# Patient Record
Sex: Female | Born: 1980 | Race: Black or African American | Hispanic: No | Marital: Single | Smoking: Never smoker
Health system: Southern US, Community
[De-identification: ages and names within clinical notes are randomized; demographics above are authoritative.]

## PROBLEM LIST (undated history)

## (undated) DIAGNOSIS — F419 Anxiety disorder, unspecified: Secondary | ICD-10-CM

## (undated) DIAGNOSIS — T7840XA Allergy, unspecified, initial encounter: Secondary | ICD-10-CM

## (undated) DIAGNOSIS — I2699 Other pulmonary embolism without acute cor pulmonale: Secondary | ICD-10-CM

## (undated) DIAGNOSIS — J302 Other seasonal allergic rhinitis: Secondary | ICD-10-CM

## (undated) DIAGNOSIS — R51 Headache: Secondary | ICD-10-CM

## (undated) DIAGNOSIS — J40 Bronchitis, not specified as acute or chronic: Secondary | ICD-10-CM

## (undated) DIAGNOSIS — B009 Herpesviral infection, unspecified: Secondary | ICD-10-CM

## (undated) DIAGNOSIS — F32A Depression, unspecified: Secondary | ICD-10-CM

## (undated) DIAGNOSIS — D649 Anemia, unspecified: Secondary | ICD-10-CM

## (undated) DIAGNOSIS — R519 Headache, unspecified: Secondary | ICD-10-CM

## (undated) DIAGNOSIS — J45909 Unspecified asthma, uncomplicated: Secondary | ICD-10-CM

## (undated) DIAGNOSIS — G709 Myoneural disorder, unspecified: Secondary | ICD-10-CM

## (undated) HISTORY — PX: TUBAL LIGATION: SHX77

## (undated) HISTORY — PX: WISDOM TOOTH EXTRACTION: SHX21

## (undated) HISTORY — PX: THERAPEUTIC ABORTION: SHX798

## (undated) HISTORY — DX: Other pulmonary embolism without acute cor pulmonale: I26.99

## (undated) HISTORY — DX: Anxiety disorder, unspecified: F41.9

## (undated) HISTORY — DX: Depression, unspecified: F32.A

## (undated) HISTORY — PX: FRACTURE SURGERY: SHX138

## (undated) HISTORY — PX: DILATION AND CURETTAGE OF UTERUS: SHX78

## (undated) HISTORY — DX: Allergy, unspecified, initial encounter: T78.40XA

## (undated) HISTORY — PX: FINGER SURGERY: SHX640

---

## 2013-10-04 ENCOUNTER — Encounter (HOSPITAL_COMMUNITY): Payer: Self-pay | Admitting: Emergency Medicine

## 2013-10-04 ENCOUNTER — Other Ambulatory Visit (HOSPITAL_COMMUNITY)
Admission: RE | Admit: 2013-10-04 | Discharge: 2013-10-04 | Disposition: A | Discharge status: Home / Self Care | Payer: 59 | Source: Ambulatory Visit | Attending: Emergency Medicine | Admitting: Emergency Medicine

## 2013-10-04 ENCOUNTER — Emergency Department (INDEPENDENT_AMBULATORY_CARE_PROVIDER_SITE_OTHER)
Admission: EM | Admit: 2013-10-04 | Discharge: 2013-10-04 | Disposition: A | Discharge status: Home / Self Care | Payer: 59 | Source: Home / Self Care | Attending: Emergency Medicine | Admitting: Emergency Medicine

## 2013-10-04 DIAGNOSIS — Z113 Encounter for screening for infections with a predominantly sexual mode of transmission: Secondary | ICD-10-CM | POA: Insufficient documentation

## 2013-10-04 DIAGNOSIS — B009 Herpesviral infection, unspecified: Secondary | ICD-10-CM

## 2013-10-04 DIAGNOSIS — N760 Acute vaginitis: Secondary | ICD-10-CM | POA: Insufficient documentation

## 2013-10-04 DIAGNOSIS — N898 Other specified noninflammatory disorders of vagina: Secondary | ICD-10-CM

## 2013-10-04 MED ORDER — VALACYCLOVIR HCL 1 G PO TABS: 1000.0000 mg | ORAL_TABLET | Freq: Every day | ORAL | Status: AC

## 2013-10-04 NOTE — ED Provider Notes (Signed)
CSN: [id number]     Arrival date & time 10/04/13  1552 History   First MD Initiated Contact with Patient 10/04/13 1643     No chief complaint on file.    (Consider location/radiation/quality/duration/timing/severity/associated sxs/prior Treatment) Patient is a 33 y.o. female presenting with STD exposure. The history is provided by the patient.  Exposure to STD This is a recurrent problem. The current episode started more than 1 week ago. The problem occurs constantly. The problem has been gradually worsening.   Ileigh Mettler is a 33 y.o. female who presents to the UC with vaginal lesions and burning that started about a week ago. 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 She is here for medication. She denies UTI symptoms.  She also complains of vaginal discharge and request testing for other STI's. She denies fever, chills, nausea, vomiting or any other problems. Last sexual intercourse 9 months ago. Hx of trichomonas. Condoms for birth control.    No past medical history on file. No past surgical history on file. No family history on file. History  Substance Use Topics  . Smoking status: Not on file  . Smokeless tobacco: Not on file  . Alcohol Use: Not on file   OB History   No data available     Review of Systems Negative except as stated in HPI   Allergies  Review of patient's allergies indicates not on file.  Home Medications  No current outpatient prescriptions on file. There were no vitals taken for this visit. Physical Exam  Nursing note and vitals reviewed. Constitutional: She is oriented to person, place, and time. She appears well-developed and well-nourished.  HENT:  Head: Atraumatic.  Eyes: EOM are normal.  Neck: Normal range of motion. Neck supple.  Cardiovascular: Normal rate.   Pulmonary/Chest: Effort normal.  Abdominal: Soft. There is no tenderness.  Genitourinary:  External genitalia with tender lesion noted left  labia major. White discharge vaginal vault.   Musculoskeletal: Normal range of motion.  Neurological: She is alert and oriented to person, place, and time. No cranial nerve deficit.  Skin: Skin is warm and dry.  Psychiatric: She has a normal mood and affect. Her behavior is normal.    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 Rx given for Valtrex. Referral to GYN. Discussed with the patient and all questioned fully answered. She will return if any problems arise.    Medication List         valACYclovir 1000 MG tablet  Commonly known as:  VALTREX  Take 1 tablet (1,000 mg total) by mouth daily.           [place], [place] 10/04/13 2204

## 2013-10-04 NOTE — ED Notes (Signed)
Pt  Reports    Symptoms  Of    A  Sore burning area   On  Vaginal  Area       She  States  Has  Had  Herpes  In  Past          She  denys  Any      Vaginal  Discharge          Or  Any bleeding

## 2013-10-04 NOTE — Discharge Instructions (Signed)
We have tested you today for gonorrhea, chlamydia, trichomonas, yeast and bacterial vaginosis. Those will not be back for a couple days. Will will call if you need additional treatment. Call Dr. Anson Crofts office for your Pap smear and follow up. Return here as needed.

## 2013-10-05 LAB — CERVICOVAGINAL ANCILLARY ONLY
Chlamydia: NEGATIVE
Neisseria Gonorrhea: NEGATIVE

## 2013-10-05 NOTE — Progress Notes (Signed)
Quick Note:  Test result was normal. No further action is needed at this time. ______ 

## 2013-10-05 NOTE — ED Provider Notes (Signed)
Medical screening examination/treatment/procedure(s) were performed by non-physician practitioner and as supervising physician I was immediately available for consultation/collaboration.  Mackensi Mahadeo, M.D.  Tyniah Kastens C Shavelle Runkel, MD 10/05/13 0805 

## 2013-10-06 ENCOUNTER — Telehealth (HOSPITAL_COMMUNITY): Payer: Self-pay | Admitting: *Deleted

## 2013-10-06 ENCOUNTER — Telehealth (HOSPITAL_COMMUNITY): Payer: Self-pay | Admitting: Emergency Medicine

## 2013-10-06 MED ORDER — METRONIDAZOLE 500 MG PO TABS
500.0000 mg | ORAL_TABLET | Freq: Two times a day (BID) | ORAL | Status: DC
Start: 2013-10-06 — End: 2018-04-01

## 2013-10-06 NOTE — ED Notes (Signed)
The patient's DNA probe came back positive for Gardnerella. She was not treated. Prescription will be called in for her for metronidazole 500 mg, #14, one twice a day. She does not have pharmacy listed, so we will need to call her, to confirm her pharmacy, then call a prescription into her pharmacy.  Harden Mo, MD 10/06/13 [phone]

## 2013-10-06 NOTE — ED Notes (Addendum)
GC/Chlamydia neg., Affirm: Gardnerella pos., Candida and Trich neg.  2/17 Message to Dr. Jake Michaelis.  2/18 Order obtained.  I called pt. and left a message to call Call 1. Roselyn Meier 10/06/2013 Pt. called back.  Pt. verified x 2 and given results.  Pt. told she needs Flagyl for bacterial vaginosis.   Pt. instructed to no alcohol while taking this medication.  Pt. wants Rx. called to [facility] on [address].  Rx. called to pharmacist @ [phone]. 10/06/2013

## 2013-10-06 NOTE — Telephone Encounter (Signed)
Message copied by Harden Mo on Thu Oct 06, 2013  7:51 AM ------      Message from: Roselyn Meier      Created: Wed Oct 05, 2013  7:53 PM      Regarding: lab       The way they document now, it shows the reference range as neg. But when you open to view it, I can see that pt. was pos. for Gardnerella and no treatment noted.  You charted on result note, no further action.  Do you want to treat this?      Roselyn Meier      10/05/2013       ------

## 2014-01-18 ENCOUNTER — Ambulatory Visit: Payer: Self-pay | Admitting: Obstetrics & Gynecology

## 2014-03-02 ENCOUNTER — Ambulatory Visit: Payer: Self-pay | Admitting: Obstetrics & Gynecology

## 2014-03-16 ENCOUNTER — Ambulatory Visit: Payer: Self-pay | Admitting: Obstetrics & Gynecology

## 2014-08-14 ENCOUNTER — Encounter: Payer: Self-pay | Admitting: *Deleted

## 2014-08-15 ENCOUNTER — Encounter: Payer: Self-pay | Admitting: Obstetrics & Gynecology

## 2017-08-18 HISTORY — PX: CARPAL TUNNEL RELEASE: SHX101

## 2017-09-15 DIAGNOSIS — G5603 Carpal tunnel syndrome, bilateral upper limbs: Secondary | ICD-10-CM | POA: Insufficient documentation

## 2017-10-28 ENCOUNTER — Encounter: Payer: Self-pay | Admitting: Obstetrics & Gynecology

## 2017-11-25 ENCOUNTER — Ambulatory Visit (INDEPENDENT_AMBULATORY_CARE_PROVIDER_SITE_OTHER): Payer: Medicaid Other | Admitting: Obstetrics & Gynecology

## 2017-11-25 ENCOUNTER — Encounter: Payer: Self-pay | Admitting: Obstetrics & Gynecology

## 2017-11-25 VITALS — BP 122/82 | HR 100 | Wt 223.4 lb

## 2017-11-25 DIAGNOSIS — D219 Benign neoplasm of connective and other soft tissue, unspecified: Secondary | ICD-10-CM | POA: Diagnosis not present

## 2017-11-25 DIAGNOSIS — N920 Excessive and frequent menstruation with regular cycle: Secondary | ICD-10-CM | POA: Diagnosis not present

## 2017-11-25 DIAGNOSIS — I2699 Other pulmonary embolism without acute cor pulmonale: Secondary | ICD-10-CM | POA: Diagnosis not present

## 2017-11-25 NOTE — Progress Notes (Signed)
Patient ID: Bonnie Myers, female   DOB: April 05, 1981, 37 y.o.   MRN: [id number]  No chief complaint on file.   HPI Bonnie Myers is a 37 y.o. female.  Single P2 (31 and 60 yo kids) here today with the issue of longstanding heavy painful periods. She had an u/s in the past that showed per her adeno and polyps. Her periods are monthly, lasting about 5 days. She has a h/o PE and cannot take OCPs. She had a BTL about 13 years ago. She takes IBU 800 with very little help. Nothing makes the pain worse. Denies dyspareunia. HPI  No past medical history on file.  No past surgical history on file.  No family history on file.  Social History Social History   Tobacco Use  . Smoking status: Never Smoker  . Smokeless tobacco: Never Used  Substance Use Topics  . Alcohol use: Yes  . Drug use: Not on file    Allergies  Allergen Reactions  . Clindamycin/Lincomycin Hives  . Naproxen Hives    Current Outpatient Medications  Medication Sig Dispense Refill  . ibuprofen (ADVIL,MOTRIN) 800 MG tablet Take 800 mg by mouth every 8 (eight) hours as needed for cramping.    . montelukast (SINGULAIR) 10 MG tablet Take 20 mg by mouth at bedtime.    . metroNIDAZOLE (FLAGYL) 500 MG tablet Take 1 tablet (500 mg total) by mouth 2 (two) times daily. 14 tablet 0   No current facility-administered medications for this visit.     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  Physical Exam Physical Exam  Breathing, conversing, and ambulating normally Well nourished, well hydrated Black female, no apparent distress Abd- benign, obese Cervix-no lesions, heavy bleeding  Data Reviewed   Assessment    Menorrhagia dysmenorrhea    Plan   check CBC, TSH, gyn  [place]  Come back 2 weeks       Jalasia Eskridge C Kingdom Vanzanten 11/25/2017, 3:25 PM

## 2017-11-26 LAB — CBC
HEMATOCRIT: 30.9 % — AB (ref 34.0–46.6)
HEMOGLOBIN: 8.7 g/dL — AB (ref 11.1–15.9)
MCH: 19 pg — ABNORMAL LOW (ref 26.6–33.0)
MCHC: 28.2 g/dL — ABNORMAL LOW (ref 31.5–35.7)
MCV: 67 fL — AB (ref 79–97)
Platelets: 411 10*3/uL — ABNORMAL HIGH (ref 150–379)
RBC: 4.59 x10E6/uL (ref 3.77–5.28)
RDW: 19.9 % — ABNORMAL HIGH (ref 12.3–15.4)
WBC: 8.4 10*3/uL (ref 3.4–10.8)

## 2017-11-26 LAB — TSH: TSH: 0.977 u[IU]/mL (ref 0.450–4.500)

## 2017-11-30 ENCOUNTER — Ambulatory Visit (HOSPITAL_COMMUNITY)
Admission: RE | Admit: 2017-11-30 | Discharge: 2017-11-30 | Disposition: A | Discharge status: Home / Self Care | Payer: Medicaid Other | Source: Ambulatory Visit | Attending: Obstetrics & Gynecology | Admitting: Obstetrics & Gynecology

## 2017-11-30 DIAGNOSIS — D219 Benign neoplasm of connective and other soft tissue, unspecified: Secondary | ICD-10-CM | POA: Diagnosis not present

## 2017-12-11 ENCOUNTER — Ambulatory Visit: Payer: Medicaid Other | Admitting: Obstetrics & Gynecology

## 2017-12-11 ENCOUNTER — Encounter

## 2018-01-01 ENCOUNTER — Encounter (HOSPITAL_BASED_OUTPATIENT_CLINIC_OR_DEPARTMENT_OTHER): Payer: Self-pay

## 2018-01-01 ENCOUNTER — Ambulatory Visit (HOSPITAL_BASED_OUTPATIENT_CLINIC_OR_DEPARTMENT_OTHER): Admit: 2018-01-01 | Payer: Medicaid Other | Admitting: Orthopedic Surgery

## 2018-01-01 SURGERY — CARPAL TUNNEL RELEASE
Anesthesia: General | Laterality: Left

## 2018-03-05 DIAGNOSIS — G5602 Carpal tunnel syndrome, left upper limb: Secondary | ICD-10-CM | POA: Diagnosis not present

## 2018-03-15 DIAGNOSIS — D509 Iron deficiency anemia, unspecified: Secondary | ICD-10-CM | POA: Diagnosis not present

## 2018-03-15 DIAGNOSIS — Z0000 Encounter for general adult medical examination without abnormal findings: Secondary | ICD-10-CM | POA: Diagnosis not present

## 2018-03-30 DIAGNOSIS — G5603 Carpal tunnel syndrome, bilateral upper limbs: Secondary | ICD-10-CM | POA: Diagnosis not present

## 2018-04-01 ENCOUNTER — Encounter: Payer: Self-pay | Admitting: Obstetrics & Gynecology

## 2018-04-01 ENCOUNTER — Ambulatory Visit (INDEPENDENT_AMBULATORY_CARE_PROVIDER_SITE_OTHER): Payer: 59 | Admitting: Obstetrics & Gynecology

## 2018-04-01 VITALS — BP 118/55 | HR 85 | Wt 224.0 lb

## 2018-04-01 DIAGNOSIS — N946 Dysmenorrhea, unspecified: Secondary | ICD-10-CM | POA: Diagnosis not present

## 2018-04-01 DIAGNOSIS — N920 Excessive and frequent menstruation with regular cycle: Secondary | ICD-10-CM | POA: Insufficient documentation

## 2018-04-01 DIAGNOSIS — D649 Anemia, unspecified: Secondary | ICD-10-CM | POA: Diagnosis not present

## 2018-04-01 LAB — POCT PREGNANCY, URINE: Preg Test, Ur: NEGATIVE

## 2018-04-01 MED ORDER — FERROUS SULFATE 325 (65 FE) MG PO TABS: 325.0000 mg | ORAL_TABLET | Freq: Every day | ORAL | 3 refills | Status: DC

## 2018-04-01 NOTE — Progress Notes (Signed)
   Subjective:    Patient ID: Bonnie Myers, female    DOB: July 30, 1981, 37 y.o.   MRN: [id number]  HPI  37 yo single P2 here with the issue of heavy painful periods. 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   She has had a BTL. Also had a PE in about 2003 and took coumadin and then heparin for a total of 3 years. She associates depo provera with the formation of the PE.  Review of Systems She has chronic constipation. Works at [employer], Print production planner service    Objective:   Physical Exam Breathing, conversing, and ambulating normally Well nourished, well hydrated Black female, no apparent distress Abd- benign     Assessment & Plan:  Heavy painful periods with anemia- rec look for causes of pelvic pain at time of laparoscopy Right dermoid- plan for laparoscopic right ovarian cystectomy/possible right oopherectomy Anemia- hbg 8.7 on 11/25/17 Repeat cbc today and schedule iron infusion and prescribing oral iron Rec miralax DAILY Suggested/rec'd endometrial ablation (HTA) but discussed risk of post ablation syndrome

## 2018-04-02 ENCOUNTER — Encounter (HOSPITAL_COMMUNITY): Payer: Self-pay

## 2018-04-15 DIAGNOSIS — R42 Dizziness and giddiness: Secondary | ICD-10-CM | POA: Diagnosis not present

## 2018-04-15 DIAGNOSIS — J45909 Unspecified asthma, uncomplicated: Secondary | ICD-10-CM | POA: Diagnosis not present

## 2018-04-15 DIAGNOSIS — H9191 Unspecified hearing loss, right ear: Secondary | ICD-10-CM | POA: Diagnosis not present

## 2018-04-21 ENCOUNTER — Other Ambulatory Visit: Payer: Self-pay | Admitting: Obstetrics & Gynecology

## 2018-04-21 ENCOUNTER — Telehealth: Payer: Self-pay

## 2018-04-21 NOTE — Telephone Encounter (Signed)
LM for [facility] to return call to the office to schedule an iron infusion.

## 2018-04-21 NOTE — Progress Notes (Signed)
ferreheme ordered for 3 weekly doses

## 2018-04-23 NOTE — Telephone Encounter (Signed)
Scheduled feraheme infusion for 9/13 @ 9am. Patient will need to check in at 2nd floor admitting. Called patient, no answer- unable to leave message. Will send mychart message

## 2018-04-26 NOTE — Patient Instructions (Addendum)
Your procedure is scheduled on: Thursday, 9/19  Enter through the Main Entrance of [facility] at: 6 am  Pick up the phone at the desk and dial [phone].  Call this number if you have problems the morning of surgery: [phone].  Remember: Do NOT eat food or Do NOT drink clear liquids (including water) after midnight Wednesday.  Take these medicines the morning of surgery with a SIP OF WATER: Singulair  Brush your teeth on the day of surgery.  Bring albuterol inhaler with you on day of surgery.  Stop herbal medications, vitamin supplements, Ibuprofen/NSAIDS at this time.  Do NOT wear jewelry (body piercing), metal hair clips/bobby pins, make-up, or nail polish. Do NOT wear lotions, powders, or perfumes.  You may wear deoderant. Do NOT shave for 48 hours prior to surgery. Do NOT bring valuables to the hospital.  Leave suitcase in car.  After surgery it may be brought to your room.  For patients admitted to the hospital, checkout time is 11:00 AM the day of discharge. Have a responsible adult drive you home and stay with you for 24 hours after your procedure.  Home with Mother Bonnie Myers cell [phone].

## 2018-04-30 ENCOUNTER — Ambulatory Visit (HOSPITAL_COMMUNITY)
Admission: RE | Admit: 2018-04-30 | Discharge: 2018-04-30 | Disposition: A | Discharge status: Home / Self Care | Payer: 59 | Source: Ambulatory Visit | Attending: Obstetrics & Gynecology | Admitting: Obstetrics & Gynecology

## 2018-04-30 ENCOUNTER — Encounter (HOSPITAL_COMMUNITY): Payer: Self-pay

## 2018-04-30 ENCOUNTER — Encounter (HOSPITAL_COMMUNITY)
Admission: RE | Admit: 2018-04-30 | Discharge: 2018-04-30 | Disposition: A | Discharge status: Home / Self Care | Payer: 59 | Source: Ambulatory Visit | Attending: Obstetrics & Gynecology | Admitting: Obstetrics & Gynecology

## 2018-04-30 ENCOUNTER — Other Ambulatory Visit: Payer: Self-pay

## 2018-04-30 DIAGNOSIS — D500 Iron deficiency anemia secondary to blood loss (chronic): Secondary | ICD-10-CM | POA: Diagnosis present

## 2018-04-30 HISTORY — DX: Headache, unspecified: R51.9

## 2018-04-30 HISTORY — DX: Headache: R51

## 2018-04-30 HISTORY — DX: Bronchitis, not specified as acute or chronic: J40

## 2018-04-30 HISTORY — DX: Myoneural disorder, unspecified: G70.9

## 2018-04-30 HISTORY — DX: Herpesviral infection, unspecified: B00.9

## 2018-04-30 HISTORY — DX: Other seasonal allergic rhinitis: J30.2

## 2018-04-30 HISTORY — DX: Anemia, unspecified: D64.9

## 2018-04-30 HISTORY — DX: Unspecified asthma, uncomplicated: J45.909

## 2018-04-30 LAB — CBC
HCT: 27.1 % — ABNORMAL LOW (ref 36.0–46.0)
Hemoglobin: 7.7 g/dL — ABNORMAL LOW (ref 12.0–15.0)
MCH: 19 pg — ABNORMAL LOW (ref 26.0–34.0)
MCHC: 28.4 g/dL — ABNORMAL LOW (ref 30.0–36.0)
MCV: 66.9 fL — ABNORMAL LOW (ref 78.0–100.0)
Platelets: 317 10*3/uL (ref 150–400)
RBC: 4.05 MIL/uL (ref 3.87–5.11)
RDW: 19.3 % — AB (ref 11.5–15.5)
WBC: 10.6 10*3/uL — AB (ref 4.0–10.5)

## 2018-04-30 MED ORDER — SODIUM CHLORIDE 0.9 % IV SOLN
510.0000 mg | INTRAVENOUS | Status: DC
  Administered 2018-04-30: 510 mg via INTRAVENOUS
  Filled 2018-04-30: qty 17

## 2018-04-30 NOTE — Discharge Instructions (Signed)

## 2018-05-03 DIAGNOSIS — R6884 Jaw pain: Secondary | ICD-10-CM | POA: Diagnosis not present

## 2018-05-03 DIAGNOSIS — R040 Epistaxis: Secondary | ICD-10-CM | POA: Diagnosis not present

## 2018-05-03 DIAGNOSIS — H93291 Other abnormal auditory perceptions, right ear: Secondary | ICD-10-CM | POA: Insufficient documentation

## 2018-05-03 DIAGNOSIS — Z0110 Encounter for examination of ears and hearing without abnormal findings: Secondary | ICD-10-CM | POA: Diagnosis not present

## 2018-05-03 DIAGNOSIS — R42 Dizziness and giddiness: Secondary | ICD-10-CM | POA: Insufficient documentation

## 2018-05-05 NOTE — Anesthesia Preprocedure Evaluation (Addendum)
Anesthesia Evaluation  Patient identified by MRN, date of birth, ID band Patient awake    Reviewed: Allergy & Precautions, H&P , NPO status , Patient's Chart, lab work & pertinent test results, reviewed documented beta blocker date and time   Airway Mallampati: II  TM Distance: >3 FB Neck ROM: full    Dental no notable dental hx. (+) Teeth Intact   Pulmonary asthma ,    Pulmonary exam normal breath sounds clear to auscultation       Cardiovascular Exercise Tolerance: Good negative cardio ROS   Rhythm:regular Rate:Normal     Neuro/Psych negative neurological ROS  negative psych ROS   GI/Hepatic negative GI ROS, Neg liver ROS,   Endo/Other  negative endocrine ROS  Renal/GU negative Renal ROS  negative genitourinary   Musculoskeletal   Abdominal   Peds  Hematology  (+) anemia ,   Anesthesia Other Findings   Reproductive/Obstetrics negative OB ROS                            Anesthesia Physical Anesthesia Plan  ASA: II  Anesthesia Plan: General   Post-op Pain Management:    Induction: Intravenous  PONV Risk Score and Plan: 3 and Ondansetron, Treatment may vary due to age or medical condition, Scopolamine patch - Pre-op and Dexamethasone  Airway Management Planned: Oral ETT  Additional Equipment:   Intra-op Plan:   Post-operative Plan: Extubation in OR  Informed Consent: I have reviewed the patients History and Physical, chart, labs and discussed the procedure including the risks, benefits and alternatives for the proposed anesthesia with the patient or authorized representative who has indicated his/her understanding and acceptance.   Dental Advisory Given  Plan Discussed with: CRNA, Anesthesiologist and Surgeon  Anesthesia Plan Comments: (  )        Anesthesia Quick Evaluation

## 2018-05-06 ENCOUNTER — Encounter (HOSPITAL_COMMUNITY): Payer: Self-pay | Admitting: *Deleted

## 2018-05-06 ENCOUNTER — Ambulatory Visit (HOSPITAL_COMMUNITY): Payer: 59 | Admitting: Anesthesiology

## 2018-05-06 ENCOUNTER — Ambulatory Visit (HOSPITAL_COMMUNITY)
Admission: AD | Admit: 2018-05-06 | Discharge: 2018-05-06 | Disposition: A | Discharge status: Home / Self Care | Payer: 59 | Source: Ambulatory Visit | Attending: Obstetrics & Gynecology | Admitting: Obstetrics & Gynecology

## 2018-05-06 ENCOUNTER — Encounter (HOSPITAL_COMMUNITY)
Admission: AD | Disposition: A | Discharge status: Home / Self Care | Payer: Self-pay | Source: Ambulatory Visit | Attending: Obstetrics & Gynecology

## 2018-05-06 ENCOUNTER — Other Ambulatory Visit: Payer: Self-pay

## 2018-05-06 DIAGNOSIS — N938 Other specified abnormal uterine and vaginal bleeding: Secondary | ICD-10-CM | POA: Insufficient documentation

## 2018-05-06 DIAGNOSIS — R102 Pelvic and perineal pain: Secondary | ICD-10-CM | POA: Diagnosis not present

## 2018-05-06 DIAGNOSIS — Z79899 Other long term (current) drug therapy: Secondary | ICD-10-CM | POA: Diagnosis not present

## 2018-05-06 DIAGNOSIS — J45909 Unspecified asthma, uncomplicated: Secondary | ICD-10-CM | POA: Diagnosis not present

## 2018-05-06 DIAGNOSIS — G8929 Other chronic pain: Secondary | ICD-10-CM | POA: Diagnosis not present

## 2018-05-06 DIAGNOSIS — Z881 Allergy status to other antibiotic agents status: Secondary | ICD-10-CM | POA: Diagnosis not present

## 2018-05-06 DIAGNOSIS — N920 Excessive and frequent menstruation with regular cycle: Secondary | ICD-10-CM | POA: Insufficient documentation

## 2018-05-06 DIAGNOSIS — D649 Anemia, unspecified: Secondary | ICD-10-CM | POA: Insufficient documentation

## 2018-05-06 DIAGNOSIS — Z888 Allergy status to other drugs, medicaments and biological substances status: Secondary | ICD-10-CM | POA: Diagnosis not present

## 2018-05-06 DIAGNOSIS — Z86711 Personal history of pulmonary embolism: Secondary | ICD-10-CM | POA: Insufficient documentation

## 2018-05-06 DIAGNOSIS — D270 Benign neoplasm of right ovary: Secondary | ICD-10-CM | POA: Diagnosis not present

## 2018-05-06 DIAGNOSIS — Z880 Allergy status to penicillin: Secondary | ICD-10-CM | POA: Diagnosis not present

## 2018-05-06 DIAGNOSIS — Z883 Allergy status to other anti-infective agents status: Secondary | ICD-10-CM | POA: Insufficient documentation

## 2018-05-06 DIAGNOSIS — N946 Dysmenorrhea, unspecified: Secondary | ICD-10-CM | POA: Insufficient documentation

## 2018-05-06 DIAGNOSIS — N803 Endometriosis of pelvic peritoneum: Secondary | ICD-10-CM | POA: Insufficient documentation

## 2018-05-06 DIAGNOSIS — N840 Polyp of corpus uteri: Secondary | ICD-10-CM | POA: Insufficient documentation

## 2018-05-06 DIAGNOSIS — N809 Endometriosis, unspecified: Secondary | ICD-10-CM | POA: Diagnosis not present

## 2018-05-06 HISTORY — PX: DILITATION & CURRETTAGE/HYSTROSCOPY WITH HYDROTHERMAL ABLATION: SHX5570

## 2018-05-06 HISTORY — PX: LAPAROSCOPIC OVARIAN CYSTECTOMY: SHX6248

## 2018-05-06 LAB — CBC
HEMATOCRIT: 32.9 % — AB (ref 36.0–46.0)
HEMOGLOBIN: 9.2 g/dL — AB (ref 12.0–15.0)
MCH: 19.7 pg — ABNORMAL LOW (ref 26.0–34.0)
MCHC: 28 g/dL — AB (ref 30.0–36.0)
MCV: 70.6 fL — ABNORMAL LOW (ref 78.0–100.0)
Platelets: 399 10*3/uL (ref 150–400)
RBC: 4.66 MIL/uL (ref 3.87–5.11)
RDW: 23.1 % — AB (ref 11.5–15.5)
WBC: 10.4 10*3/uL (ref 4.0–10.5)

## 2018-05-06 LAB — PREGNANCY, URINE: Preg Test, Ur: NEGATIVE

## 2018-05-06 SURGERY — EXCISION, CYST, OVARY, LAPAROSCOPIC
Anesthesia: General | Laterality: Right

## 2018-05-06 MED ORDER — OXYCODONE HCL 5 MG PO TABS
ORAL_TABLET | ORAL | Status: AC
  Filled 2018-05-06: qty 1

## 2018-05-06 MED ORDER — MIDAZOLAM HCL 2 MG/2ML IJ SOLN
INTRAMUSCULAR | Status: DC | PRN
  Administered 2018-05-06: 2 mg via INTRAVENOUS

## 2018-05-06 MED ORDER — OXYCODONE HCL 5 MG/5ML PO SOLN: 5.0000 mg | Freq: Once | ORAL | Status: AC | PRN

## 2018-05-06 MED ORDER — OXYCODONE-ACETAMINOPHEN 5-325 MG PO TABS: 1.0000 | ORAL_TABLET | Freq: Four times a day (QID) | ORAL | 0 refills | Status: DC | PRN

## 2018-05-06 MED ORDER — DEXAMETHASONE SODIUM PHOSPHATE 4 MG/ML IJ SOLN
INTRAMUSCULAR | Status: AC
  Filled 2018-05-06: qty 1

## 2018-05-06 MED ORDER — OXYCODONE HCL 5 MG PO TABS
5.0000 mg | ORAL_TABLET | Freq: Once | ORAL | Status: AC | PRN
  Administered 2018-05-06: 5 mg via ORAL

## 2018-05-06 MED ORDER — ROCURONIUM BROMIDE 100 MG/10ML IV SOLN
INTRAVENOUS | Status: AC
  Filled 2018-05-06: qty 1

## 2018-05-06 MED ORDER — FENTANYL CITRATE (PF) 250 MCG/5ML IJ SOLN
INTRAMUSCULAR | Status: AC
  Filled 2018-05-06: qty 5

## 2018-05-06 MED ORDER — LIDOCAINE HCL (CARDIAC) PF 100 MG/5ML IV SOSY
PREFILLED_SYRINGE | INTRAVENOUS | Status: AC
  Filled 2018-05-06: qty 5

## 2018-05-06 MED ORDER — LACTATED RINGERS IV SOLN
INTRAVENOUS | Status: DC
  Administered 2018-05-06 (×2): via INTRAVENOUS
  Administered 2018-05-06: 1000 mL via INTRAVENOUS

## 2018-05-06 MED ORDER — ACETAMINOPHEN 160 MG/5ML PO SOLN: 325.0000 mg | ORAL | Status: DC | PRN

## 2018-05-06 MED ORDER — PROPOFOL 10 MG/ML IV BOLUS
INTRAVENOUS | Status: AC
  Filled 2018-05-06: qty 20

## 2018-05-06 MED ORDER — HYDROMORPHONE HCL 1 MG/ML IJ SOLN
INTRAMUSCULAR | Status: AC
  Filled 2018-05-06: qty 1

## 2018-05-06 MED ORDER — SCOPOLAMINE 1 MG/3DAYS TD PT72
MEDICATED_PATCH | TRANSDERMAL | Status: AC
  Administered 2018-05-06: 1.5 mg via TRANSDERMAL
  Filled 2018-05-06: qty 1

## 2018-05-06 MED ORDER — FENTANYL CITRATE (PF) 100 MCG/2ML IJ SOLN
INTRAMUSCULAR | Status: AC
  Filled 2018-05-06: qty 2

## 2018-05-06 MED ORDER — SUGAMMADEX SODIUM 200 MG/2ML IV SOLN
INTRAVENOUS | Status: AC
  Filled 2018-05-06: qty 2

## 2018-05-06 MED ORDER — MIDAZOLAM HCL 2 MG/2ML IJ SOLN
INTRAMUSCULAR | Status: AC
  Filled 2018-05-06: qty 2

## 2018-05-06 MED ORDER — BUPIVACAINE HCL (PF) 0.5 % IJ SOLN
INTRAMUSCULAR | Status: AC
  Filled 2018-05-06: qty 30

## 2018-05-06 MED ORDER — HYDROMORPHONE HCL 1 MG/ML IJ SOLN
0.5000 mg | INTRAMUSCULAR | Status: DC | PRN
  Administered 2018-05-06 (×2): 0.5 mg via INTRAVENOUS

## 2018-05-06 MED ORDER — ACETAMINOPHEN 325 MG PO TABS: 325.0000 mg | ORAL_TABLET | ORAL | Status: DC | PRN

## 2018-05-06 MED ORDER — LIDOCAINE HCL (CARDIAC) PF 100 MG/5ML IV SOSY
PREFILLED_SYRINGE | INTRAVENOUS | Status: DC | PRN
  Administered 2018-05-06: 50 mg via INTRAVENOUS

## 2018-05-06 MED ORDER — PROMETHAZINE HCL 25 MG/ML IJ SOLN
INTRAMUSCULAR | Status: AC
  Filled 2018-05-06: qty 1

## 2018-05-06 MED ORDER — ONDANSETRON HCL 4 MG/2ML IJ SOLN
4.0000 mg | Freq: Once | INTRAMUSCULAR | Status: AC | PRN
  Administered 2018-05-06: 4 mg via INTRAVENOUS

## 2018-05-06 MED ORDER — ROCURONIUM BROMIDE 100 MG/10ML IV SOLN
INTRAVENOUS | Status: DC | PRN
  Administered 2018-05-06: 50 mg via INTRAVENOUS

## 2018-05-06 MED ORDER — PHENYLEPHRINE HCL 10 MG/ML IJ SOLN
INTRAMUSCULAR | Status: DC | PRN
  Administered 2018-05-06: .08 mg via INTRAVENOUS

## 2018-05-06 MED ORDER — SCOPOLAMINE 1 MG/3DAYS TD PT72
1.0000 | MEDICATED_PATCH | Freq: Once | TRANSDERMAL | Status: DC
  Administered 2018-05-06: 1.5 mg via TRANSDERMAL

## 2018-05-06 MED ORDER — PROMETHAZINE HCL 25 MG/ML IJ SOLN
6.2500 mg | Freq: Four times a day (QID) | INTRAMUSCULAR | Status: DC | PRN
  Administered 2018-05-06: 12.5 mg via INTRAVENOUS

## 2018-05-06 MED ORDER — KETOROLAC TROMETHAMINE 30 MG/ML IJ SOLN
INTRAMUSCULAR | Status: AC
  Filled 2018-05-06: qty 1

## 2018-05-06 MED ORDER — FENTANYL CITRATE (PF) 100 MCG/2ML IJ SOLN
25.0000 ug | INTRAMUSCULAR | Status: DC | PRN
  Administered 2018-05-06 (×3): 50 ug via INTRAVENOUS

## 2018-05-06 MED ORDER — BUPIVACAINE HCL (PF) 0.5 % IJ SOLN
INTRAMUSCULAR | Status: DC | PRN
  Administered 2018-05-06: 22 mL

## 2018-05-06 MED ORDER — ONDANSETRON HCL 4 MG/2ML IJ SOLN
INTRAMUSCULAR | Status: AC
  Administered 2018-05-06: 4 mg via INTRAVENOUS
  Filled 2018-05-06: qty 2

## 2018-05-06 MED ORDER — ACETAMINOPHEN 10 MG/ML IV SOLN
INTRAVENOUS | Status: AC
  Filled 2018-05-06: qty 100

## 2018-05-06 MED ORDER — ACETAMINOPHEN 10 MG/ML IV SOLN
1000.0000 mg | Freq: Once | INTRAVENOUS | Status: AC
  Administered 2018-05-06: 1000 mg via INTRAVENOUS

## 2018-05-06 MED ORDER — MEPERIDINE HCL 25 MG/ML IJ SOLN: 6.2500 mg | INTRAMUSCULAR | Status: DC | PRN

## 2018-05-06 MED ORDER — SODIUM CHLORIDE 0.9 % IR SOLN
Status: DC | PRN
  Administered 2018-05-06: 3000 mL

## 2018-05-06 MED ORDER — GLYCOPYRROLATE 0.2 MG/ML IJ SOLN
INTRAMUSCULAR | Status: AC
  Filled 2018-05-06: qty 1

## 2018-05-06 MED ORDER — PROPOFOL 10 MG/ML IV BOLUS
INTRAVENOUS | Status: DC | PRN
  Administered 2018-05-06: 200 mg via INTRAVENOUS

## 2018-05-06 MED ORDER — ONDANSETRON HCL 4 MG/2ML IJ SOLN
INTRAMUSCULAR | Status: DC | PRN
  Administered 2018-05-06: 4 mg via INTRAVENOUS

## 2018-05-06 MED ORDER — FENTANYL CITRATE (PF) 100 MCG/2ML IJ SOLN
INTRAMUSCULAR | Status: DC | PRN
  Administered 2018-05-06 (×3): 50 ug via INTRAVENOUS
  Administered 2018-05-06: 25 ug via INTRAVENOUS

## 2018-05-06 MED ORDER — PHENYLEPHRINE 40 MCG/ML (10ML) SYRINGE FOR IV PUSH (FOR BLOOD PRESSURE SUPPORT)
PREFILLED_SYRINGE | INTRAVENOUS | Status: AC
  Filled 2018-05-06: qty 10

## 2018-05-06 MED ORDER — ALBUTEROL SULFATE HFA 108 (90 BASE) MCG/ACT IN AERS
INHALATION_SPRAY | RESPIRATORY_TRACT | Status: DC | PRN
  Administered 2018-05-06 (×2): 5 via RESPIRATORY_TRACT

## 2018-05-06 MED ORDER — ONDANSETRON HCL 4 MG/2ML IJ SOLN
INTRAMUSCULAR | Status: AC
  Filled 2018-05-06: qty 2

## 2018-05-06 MED ORDER — SUGAMMADEX SODIUM 200 MG/2ML IV SOLN
INTRAVENOUS | Status: DC | PRN
  Administered 2018-05-06: 200 mg via INTRAVENOUS

## 2018-05-06 MED ORDER — DEXAMETHASONE SODIUM PHOSPHATE 10 MG/ML IJ SOLN
INTRAMUSCULAR | Status: DC | PRN
  Administered 2018-05-06: 8 mg via INTRAVENOUS

## 2018-05-06 MED ORDER — GLYCOPYRROLATE 0.2 MG/ML IJ SOLN
INTRAMUSCULAR | Status: DC | PRN
  Administered 2018-05-06: 0.2 mg via INTRAVENOUS

## 2018-05-06 SURGICAL SUPPLY — 40 items
CABLE HIGH FREQUENCY MONO STRZ (ELECTRODE) IMPLANT
CANISTER SUCT 3000ML PPV (MISCELLANEOUS) ×3 IMPLANT
DERMABOND ADHESIVE PROPEN (GAUZE/BANDAGES/DRESSINGS) ×1
DERMABOND ADVANCED .7 DNX6 (GAUZE/BANDAGES/DRESSINGS) ×2 IMPLANT
DILATOR CANAL MILEX (MISCELLANEOUS) IMPLANT
DRSG OPSITE POSTOP 3X4 (GAUZE/BANDAGES/DRESSINGS) ×3 IMPLANT
DURAPREP 26ML APPLICATOR (WOUND CARE) ×3 IMPLANT
ELECT REM PT RETURN 9FT ADLT (ELECTROSURGICAL)
ELECTRODE REM PT RTRN 9FT ADLT (ELECTROSURGICAL) IMPLANT
GLOVE BIO SURGEON STRL SZ 6.5 (GLOVE) ×6 IMPLANT
GLOVE BIOGEL PI IND STRL 7.0 (GLOVE) ×4 IMPLANT
GLOVE BIOGEL PI INDICATOR 7.0 (GLOVE) ×2
GOWN STRL REUS W/TWL LRG LVL3 (GOWN DISPOSABLE) ×6 IMPLANT
NDL SAFETY ECLIPSE 18X1.5 (NEEDLE) ×2 IMPLANT
NEEDLE HYPO 18GX1.5 SHARP (NEEDLE) ×1
NEEDLE INSUFFLATION 120MM (ENDOMECHANICALS) ×3 IMPLANT
NEEDLE SPNL 18GX3.5 QUINCKE PK (NEEDLE) ×3 IMPLANT
NS IRRIG 1000ML POUR BTL (IV SOLUTION) ×3 IMPLANT
PACK LAPAROSCOPY BASIN (CUSTOM PROCEDURE TRAY) ×3 IMPLANT
PACK TRENDGUARD 450 HYBRID PRO (MISCELLANEOUS) ×2 IMPLANT
PACK VAGINAL MINOR WOMEN LF (CUSTOM PROCEDURE TRAY) ×3 IMPLANT
PAD OB MATERNITY 4.3X12.25 (PERSONAL CARE ITEMS) ×3 IMPLANT
PAD PREP 24X48 CUFFED NSTRL (MISCELLANEOUS) ×3 IMPLANT
POUCH SPECIMEN RETRIEVAL 10MM (ENDOMECHANICALS) ×3 IMPLANT
PROTECTOR NERVE ULNAR (MISCELLANEOUS) ×6 IMPLANT
SET GENESYS HTA PROCERVA (MISCELLANEOUS) IMPLANT
SET IRRIG TUBING LAPAROSCOPIC (IRRIGATION / IRRIGATOR) ×3 IMPLANT
SHEARS HARMONIC ACE PLUS 36CM (ENDOMECHANICALS) ×3 IMPLANT
SLEEVE XCEL OPT CAN 5 100 (ENDOMECHANICALS) IMPLANT
STRIP CLOSURE SKIN 1/2X4 (GAUZE/BANDAGES/DRESSINGS) ×3 IMPLANT
SUT VICRYL 0 UR6 27IN ABS (SUTURE) ×3 IMPLANT
SUT VICRYL 4-0 PS2 18IN ABS (SUTURE) ×3 IMPLANT
SYR 30ML LL (SYRINGE) ×3 IMPLANT
SYSTEM CARTER THOMASON II (TROCAR) ×3 IMPLANT
TOWEL OR 17X24 6PK STRL BLUE (TOWEL DISPOSABLE) ×6 IMPLANT
TRENDGUARD 450 HYBRID PRO PACK (MISCELLANEOUS) ×3
TROCAR OPTI TIP 5M 100M (ENDOMECHANICALS) ×3 IMPLANT
TROCAR XCEL DIL TIP R 11M (ENDOMECHANICALS) ×3 IMPLANT
TUBING INSUF HEATED (TUBING) ×3 IMPLANT
WARMER LAPAROSCOPE (MISCELLANEOUS) ×3 IMPLANT

## 2018-05-06 NOTE — Discharge Instructions (Signed)
Endometriosis Endometriosis is a condition in which the tissue that lines the uterus (endometrium) grows outside of its normal location. The tissue may grow in many locations close to the uterus, but it commonly grows on the ovaries, fallopian tubes, vagina, or bowel. When the uterus sheds the endometrium every menstrual cycle, there is bleeding wherever the endometrial tissue is located. This can cause pain because blood is irritating to tissues that are not normally exposed to it. What are the causes? The cause of endometriosis is not known. What increases the risk? You may be more likely to develop endometriosis if you:  Have a family history of endometriosis.  Have never given birth.  Started your period at age 28 or younger.  Have high levels of estrogen in your body.  Were exposed to a certain medicine (diethylstilbestrol) before you were born (in utero).  Had low birth weight.  Were born as a twin, triplet, or other multiple.  Have a BMI of less than 25. BMI is an estimate of body fat and is calculated from height and weight.  What are the signs or symptoms? Often, there are no symptoms of this condition. If you do have symptoms, they may:  Vary depending on where your endometrial tissue is growing.  Occur during your menstrual period (most common) or midcycle.  Come and go, or you may go months with no symptoms at all.  Stop with menopause.  Symptoms may include:  Pain in the back or abdomen.  Heavier bleeding during periods.  Pain during sex.  Painful bowel movements.  Infertility.  Pelvic pain.  Bleeding more than once a month.  How is this diagnosed? This condition is diagnosed based on your symptoms and a physical exam. You may have tests, such as:  Blood tests and urine tests. These may be done to help rule out other possible causes of your symptoms.  Ultrasound, to look for abnormal tissues.  An X-ray of the lower bowel (barium enema).  An  ultrasound that is done through the vagina (transvaginally).  CT scan.  MRI.  Laparoscopy. In this procedure, a lighted, pencil-sized instrument called a laparoscope is inserted into your abdomen through an incision. The laparoscope allows your health care provider to look at the organs inside your body and check for abnormal tissue to confirm the diagnosis. If abnormal tissue is found, your health care provider may remove a small piece of tissue (biopsy) to be examined under a microscope.  How is this treated? Treatment for this condition may include:  Medicines to relieve pain, such as NSAIDs.  Hormone therapy. This involves using artificial (synthetic) hormones to reduce endometrial tissue growth. Your health care provider may recommend using a hormonal form of birth control, or other medicines.  Surgery. This may be done to remove abnormal endometrial tissue. ? In some cases, tissue may be removed using a laparoscope and a laser (laparoscopic laser treatment). ? In severe cases, surgery may be done to remove the fallopian tubes, uterus, and ovaries (hysterectomy).  Follow these instructions at home:  Take over-the-counter and prescription medicines only as told by your health care provider.  Do not drive or use heavy machinery while taking prescription pain medicine.  Try to avoid activities that cause pain, including sexual activity.  Keep all follow-up visits as told by your health care provider. This is important. Contact a health care provider if:  You have pain in the area between your hip bones (pelvic area) that occurs: ? Before, during, or  after your period. ? In between your period and gets worse during your period. ? During or after sex. ? With bowel movements or urination, especially during your period.  You have problems getting pregnant.  You have a fever. Get help right away if:  You have severe pain that does not get better with medicine.  You have severe  nausea and vomiting, or you cannot eat without vomiting.  You have pain that affects only the lower, right side of your abdomen.  You have abdominal pain that gets worse.  You have abdominal swelling.  You have blood in your stool. This information is not intended to replace advice given to you by your health care provider. Make sure you discuss any questions you have with your health care provider. Document Released: 08/01/2000 Document Revised: 05/09/2016 Document Reviewed: 01/05/2016 Elsevier Interactive Patient Education  2018 [employer]. Hysteroscopy, Care After Refer to this sheet in the next few weeks. These instructions provide you with information on caring for yourself after your procedure. Your health care provider may also give you more specific instructions. Your treatment has been planned according to current medical practices, but problems sometimes occur. Call your health care provider if you have any problems or questions after your procedure. What can I expect after the procedure? After your procedure, it is typical to have the following:  You may have some cramping. This normally lasts for a couple days.  You may have bleeding. This can vary from light spotting for a few days to menstrual-like bleeding for 3-7 days.  Follow these instructions at home:  Rest for the first 1-2 days after the procedure.  Only take over-the-counter or prescription medicines as directed by your health care provider. Do not take aspirin. It can increase the chances of bleeding.  Take showers instead of baths for 2 weeks or as directed by your health care provider.  Do not drive for 24 hours or as directed.  Do not drink alcohol while taking pain medicine.  Do not use tampons, douche, or have sexual intercourse for 2 weeks or until your health care provider says it is okay.  Take your temperature twice a day for 4-5 days. Write it down each time.  Follow your health care provider's  advice about diet, exercise, and lifting.  If you develop constipation, you may: ? Take a mild laxative if your health care provider approves. ? Add bran foods to your diet. ? Drink enough fluids to keep your urine clear or pale yellow.  Try to have someone with you or available to you for the first 24-48 hours, especially if you were given a general anesthetic.  Follow up with your health care provider as directed. Contact a health care provider if:  You feel dizzy or lightheaded.  You feel sick to your stomach (nauseous).  You have abnormal vaginal discharge.  You have a rash.  You have pain that is not controlled with medicine. Get help right away if:  You have bleeding that is heavier than a normal menstrual period.  You have a fever.  You have increasing cramps or pain, not controlled with medicine.  You have new belly (abdominal) pain.  You pass out.  You have pain in the tops of your shoulders (shoulder strap areas).  You have shortness of breath. This information is not intended to replace advice given to you by your health care provider. Make sure you discuss any questions you have with your health care provider. Document  Released: 05/25/2013 Document Revised: 01/10/2016 Document Reviewed: 03/03/2013 Elsevier Interactive Patient Education  2017 [employer]. Dilation and Curettage or Vacuum Curettage, Care After This sheet gives you information about how to care for yourself after your procedure. Your health care provider may also give you more specific instructions. If you have problems or questions, contact your health care provider. What can I expect after the procedure? After your procedure, it is common to have:  Mild pain or cramping.  Some vaginal bleeding or spotting.  These may last for up to 2 weeks after your procedure. Follow these instructions at home: Activity   Do not drive or use heavy machinery while taking prescription pain  medicine.  Avoid driving for the first 24 hours after your procedure.  Take frequent, short walks, followed by rest periods, throughout the day. Ask your health care provider what activities are safe for you. After 1-2 days, you may be able to return to your normal activities.  Do not lift anything heavier than 10 lb (4.5 kg) until your health care provider approves.  For at least 2 weeks, or as long as told by your health care provider, do not: ? Douche. ? Use tampons. ? Have sexual intercourse. General instructions   Take over-the-counter and prescription medicines only as told by your health care provider. This is especially important if you take blood thinning medicine.  Do not take baths, swim, or use a hot tub until your health care provider approves. Take showers instead of baths.  Wear compression stockings as told by your health care provider. These stockings help to prevent blood clots and reduce swelling in your legs.  It is your responsibility to get the results of your procedure. Ask your health care provider, or the department performing the procedure, when your results will be ready.  Keep all follow-up visits as told by your health care provider. This is important. Contact a health care provider if:  You have severe cramps that get worse or that do not get better with medicine.  You have severe abdominal pain.  You cannot drink fluids without vomiting.  You develop pain in a different area of your pelvis.  You have bad-smelling vaginal discharge.  You have a rash. Get help right away if:  You have vaginal bleeding that soaks more than one sanitary pad in 1 hour, for 2 hours in a row.  You pass large blood clots from your vagina.  You have a fever that is above 100.56F (38.0C).  Your abdomen feels very tender or hard.  You have chest pain.  You have shortness of breath.  You cough up blood.  You feel dizzy or light-headed.  You faint.  You have  pain in your neck or shoulder area. This information is not intended to replace advice given to you by your health care provider. Make sure you discuss any questions you have with your health care provider. Document Released: 08/01/2000 Document Revised: 04/02/2016 Document Reviewed: 03/06/2016 Elsevier Interactive Patient Education  2018 [place] Anesthesia Home Care Instructions  Activity: Get plenty of rest for the remainder of the day. A responsible individual must stay with you for 24 hours following the procedure.  For the next 24 hours, DO NOT: -Drive a car -Paediatric nurse -Drink alcoholic beverages -Take any medication unless instructed by your physician -Make any legal decisions or sign important papers.  Meals: Start with liquid foods such as gelatin or soup. Progress to regular foods as tolerated.  Avoid greasy, spicy, heavy foods. If nausea and/or vomiting occur, drink only clear liquids until the nausea and/or vomiting subsides. Call your physician if vomiting continues.  Special Instructions/Symptoms: Your throat may feel dry or sore from the anesthesia or the breathing tube placed in your throat during surgery. If this causes discomfort, gargle with warm salt water. The discomfort should disappear within 24 hours.  If you had a scopolamine patch placed behind your ear for the management of post- operative nausea and/or vomiting:  1. The medication in the patch is effective for 72 hours, after which it should be removed.  Wrap patch in a tissue and discard in the trash. Wash hands thoroughly with soap and water. 2. You may remove the patch earlier than 72 hours if you experience unpleasant side effects which may include dry mouth, dizziness or visual disturbances. 3. Avoid touching the patch. Wash your hands with soap and water after contact with the patch.

## 2018-05-06 NOTE — Anesthesia Procedure Notes (Signed)
Procedure Name: LMA Insertion Date/Time: 05/06/2018 8:39 AM Performed by: Bufford Spikes, CRNA Pre-anesthesia Checklist: Patient identified, Emergency Drugs available, Suction available and Patient being monitored Patient Re-evaluated:Patient Re-evaluated prior to induction Oxygen Delivery Method: Circle system utilized Preoxygenation: Pre-oxygenation with 100% oxygen Induction Type: IV induction Ventilation: Mask ventilation without difficulty LMA: LMA inserted LMA Size: 4.0 Laryngoscope Size: Miller and 2 Grade View: Grade I Tube type: Oral Tube size: 7.0 mm Number of attempts: 1 Airway Equipment and Method: Bite block Placement Confirmation: positive ETCO2 Secured at: 21 cm Tube secured with: Tape Dental Injury: Teeth and Oropharynx as per pre-operative assessment

## 2018-05-06 NOTE — Op Note (Signed)
05/06/2018  9:40 AM  PATIENT:  Bonnie Myers  37 y.o. female  PRE-OPERATIVE DIAGNOSIS:  Right dermoid, chronic pelvic pain, DUB, anmeia  POST-OPERATIVE DIAGNOSIS:  Stage 1 endometriosis, same  PROCEDURE:  Procedure(s): LAPAROSCOPY DIAGNOSTIC WITH BIOPSY (Right) DILATATION & CURETTAGE/HYSTEROSCOPY WITH MINERVA  ABLATION (N/A)  EXPLORATION OF RIGHT OVARY  SURGEON:  Surgeon(s) and Role:    * Lenix Kidd, Wilhemina Cash, MD - Primary    * Anyanwu, Sallyanne Havers, MD - Assisting  ANESTHESIA:   local and general  EBL:  10 mL   BLOOD ADMINISTERED:none  DRAINS: none   LOCAL MEDICATIONS USED:  MARCAINE     SPECIMEN:  Source of Specimen:  peritoneum with endometriosis from right ovarian fossa and endometrial curettings  DISPOSITION OF SPECIMEN:  PATHOLOGY  COUNTS:  YES  TOURNIQUET:  * No tourniquets in log *  DICTATION: .Dragon Dictation  PLAN OF CARE: Discharge to home after PACU  PATIENT DISPOSITION:  PACU - hemodynamically stable.   Delay start of Pharmacological VTE agent (>24hrs) due to surgical blood loss or risk of bleeding: not applicable  The risks, benefits, and alternatives of surgery were explained, understood, accepted. In the operating room she was placed in the dorsal lithotomy position, and general anesthesia was given without complication. Her abdomen and vagina were prepped and draped in the usual sterile fashion. A timeout procedure was done. A bimanual exam revealed a 6 week size, globular, somewhat mushy anteverted and mobile uterus. Her adnexa felt normal. A Hulka manipulator was placed.Gloves were changed, and attention was turned to the abdomen. Approximately the 5 mL of 0.5% Marcaine was injected into the umbilicus. A vertical incision was made at the site. A varies needle was placed intraperitoneally. Low-flow CO2 was used to insufflate the abdomen to approximately 3-1/2 L. Once a good pneumoperitoneum was established, a 5 mm Excel trocar was placed. Laparoscopy confirmed  correct placement. A 5 mm port was placed in the left lower quadrant and a 10 mm port in the right lower quadrant under direct laparoscopic visualization after injecting 0.5% Marcaine in the incision sites. Her pelvis and upper abdomen appeared normal with the exception of a 5 mm area of classic brown endometriosis lesion. It was only about 5 mm away from the upper edge of the normal appearing ureter. 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 I used a biopsy forceps to remove the upper half of the endometriosis lesion, staying well away from the ureter. The area was hemostatic. A Harmonic scapel was used to open the normal appearing right ovary in a vertical fashion superficially, looking for the 1 cm dermoid that was seen on ultrasound. I did not see the dermoid. The ovary was hemostatic.I attempted to place the [employer] device but the 10 mm fascial hole had closed so much that I was unable to even place my index finger through the hole. I allowed the CO2 to escape from the abdomen.  I removed the 5 mm ports and noted hemostasis.  A subcuticular closure was done with 4-0 Vicryl suture at all incision sites.  I then proceeded with the vaginal portion of the case.   Her cervix was carefully and slowly dilated to accommodate a small curette. A curettage was done in all quadrants and the fundus of the uterus. A large amount of polypoid-type  tissue was obtained. The uterus sounded to 11 cm. The cervix was about 4 cm long. I then placed the Minerva device. It passed its tests and ran for the  usual time of 2 minute. There was no bleeding noted at the end of the case. She was taken to the recovery room after being extubated. She tolerated the procedure well.

## 2018-05-06 NOTE — H&P (Signed)
Bonnie Myers is an 37 y.o. single P2 here with the issue of heavy painful periods. 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 Her hbg was 7.7 on 9-13. I ordered weekly feraheme. Her hbg today is 9.2.      Patient's last menstrual period was 04/25/2018.    Past Medical History:  Diagnosis Date  . Anemia   . Asthma   . Bronchitis   . Headache   . HSV infection   . Neuromuscular disorder ([place])    right - carpal tunnel  . Pulmonary embolus ([place])    [place] - at 23 yrs ago, no problems since  . Seasonal allergies   . SVD (spontaneous vaginal delivery)    x 2    Past Surgical History:  Procedure Laterality Date  . CARPAL TUNNEL RELEASE Left 2019  . DILATION AND CURETTAGE OF UTERUS     MAB  . FINGER SURGERY     right hand 4th/ring finger  . THERAPEUTIC ABORTION     x 2 recvd anesthesia   . TUBAL LIGATION     2006  . WISDOM TOOTH EXTRACTION      History reviewed. No pertinent family history.  Social History:  reports that she has never smoked. She has never used smokeless tobacco. She reports that she drinks about 3.0 standard drinks of alcohol per week. She reports that she has current or past drug history. Drug: Marijuana.  Allergies:  Allergies  Allergen Reactions  . Penicillins Hives  . Azithromycin Hives  . Clindamycin/Lincomycin Hives  . Coumadin [Warfarin] Hives  . Diflucan [Fluconazole] Hives  . Naproxen Hives    Medications Prior to Admission  Medication Sig Dispense Refill Last Dose  . albuterol (VENTOLIN HFA) 108 (90 Base) MCG/ACT inhaler Inhale 2 puffs into the lungs every 4 (four) hours as needed for wheezing or shortness of breath.    05/05/2018 at Unknown time  . Aspirin-Caffeine (BAYER BACK & BODY PO) Take 2 tablets by mouth 2 (two) times daily as needed.   Past Month at Unknown time  . ibuprofen (ADVIL,MOTRIN) 800 MG tablet Take 800 mg by mouth every 8 (eight) hours as needed for cramping.   Past Month at Unknown time   . montelukast (SINGULAIR) 10 MG tablet Take 20 mg by mouth every morning.    05/06/2018 at 0630    ROS  Blood pressure 126/89, pulse 90, temperature 98.4 F (36.9 C), temperature source Oral, last menstrual period 04/25/2018, SpO2 97 %. Physical Exam  Heart- rrr Lungs-CTAB Abd- benign  Results for orders placed or performed during the hospital encounter of 05/06/18 (from the past 24 hour(s))  Pregnancy, urine     Status: None   Collection Time: 05/06/18  6:00 AM  Result Value Ref Range   Preg Test, Ur NEGATIVE NEGATIVE  CBC     Status: Abnormal   Collection Time: 05/06/18  6:25 AM  Result Value Ref Range   WBC 10.4 4.0 - 10.5 K/uL   RBC 4.66 3.87 - 5.11 MIL/uL   Hemoglobin 9.2 (L) 12.0 - 15.0 g/dL   HCT 32.9 (L) 36.0 - 46.0 %   MCV 70.6 (L) 78.0 - 100.0 fL   MCH 19.7 (L) 26.0 - 34.0 pg   MCHC 28.0 (L) 30.0 - 36.0 g/dL   RDW 23.1 (H) 11.5 - 15.5 %   Platelets 399 150 - 400 K/uL    No results found.  Assessment/Plan: Heavy painful periods with  anemia- rec look for causes of pelvic pain at time of laparoscopy,  Plan for endometrial ablation. Right dermoid- plan for laparoscopic right ovarian cystectomy/possible right oopherectomy  She understands the risks of surgery, including, but not to infection, bleeding, DVTs, damage to bowel, bladder, ureters. She wishes to proceed.     Emily Filbert 05/06/2018, 7:23 AM

## 2018-05-06 NOTE — Anesthesia Postprocedure Evaluation (Signed)
Anesthesia Post Note  Patient: Bonnie Myers  Procedure(s) Performed: LAPAROSCOPY DIAGNOSTIC WITH BIOPSY (Right ) DILATATION & CURETTAGE WITH MINERVA  ABLATION (N/A )     Patient location during evaluation: PACU Anesthesia Type: General Level of consciousness: awake and alert Pain management: pain level controlled Vital Signs Assessment: post-procedure vital signs reviewed and stable Respiratory status: spontaneous breathing, nonlabored ventilation, respiratory function stable and patient connected to nasal cannula oxygen Cardiovascular status: blood pressure returned to baseline and stable Postop Assessment: no apparent nausea or vomiting Anesthetic complications: no    Last Vitals:  Vitals:   05/06/18 1215 05/06/18 1230  BP: 121/72   Pulse: 73   Resp: 11   Temp:  36.8 C  SpO2: 93% 93%    Last Pain:  Vitals:   05/06/18 1230  TempSrc:   PainSc: 6    Pain Goal: Patients Stated Pain Goal: 4 (05/06/18 1230)               Bonnie Myers

## 2018-05-06 NOTE — Transfer of Care (Signed)
Immediate Anesthesia Transfer of Care Note  Patient: Bonnie Myers  Procedure(s) Performed: LAPAROSCOPY DIAGNOSTIC WITH BIOPSY (Right ) DILATATION & CURETTAGE/HYSTEROSCOPY WITH MINERVA  ABLATION (N/A )  Patient Location: PACU  Anesthesia Type:General  Level of Consciousness: awake, alert  and oriented  Airway & Oxygen Therapy: Patient Spontanous Breathing and Patient connected to nasal cannula oxygen  Post-op Assessment: Report given to RN and Post -op Vital signs reviewed and stable  Post vital signs: Reviewed and stable  Last Vitals:  Vitals Value Taken Time  BP 126/58 05/06/2018 10:00 AM  Temp    Pulse 91 05/06/2018 10:02 AM  Resp 16 05/06/2018 10:02 AM  SpO2 96 % 05/06/2018 10:02 AM  Vitals shown include unvalidated device data.  Last Pain:  Vitals:   05/06/18 0635  TempSrc: Oral  PainSc: 0-No pain      Patients Stated Pain Goal: 4 (09/40/76 8088)  Complications: No apparent anesthesia complications

## 2018-05-07 ENCOUNTER — Encounter (HOSPITAL_COMMUNITY): Payer: Self-pay | Admitting: Obstetrics & Gynecology

## 2018-05-07 ENCOUNTER — Telehealth: Payer: Self-pay | Admitting: General Practice

## 2018-05-07 DIAGNOSIS — G8918 Other acute postprocedural pain: Secondary | ICD-10-CM

## 2018-05-07 MED ORDER — IBUPROFEN 800 MG PO TABS: 800.0000 mg | ORAL_TABLET | Freq: Three times a day (TID) | ORAL | 0 refills | Status: DC | PRN

## 2018-05-07 NOTE — Telephone Encounter (Signed)
Patient called & left message on nurse voicemail line stating she is a patient of Dr Alease Medina and just had surgery yesterday. Patient states Dr Hulan Fray talked to her about putting her on a blood thinner but no prescription is at her pharmacy. Patient also states she was prescribed percocet, but would like something else. Called patient and discussed she did not need to take blood thinners per Dr Hulan Fray. Patient verbalized understanding & states the percocet knocks her out and she would like a Rx for Ibuprofen 800mg . Asked patient about allergy to that and she states she only has a problem with naproxen & brand name Advil, but can take prescription ibuprofen with no problem. Rx sent in per Dr Hulan Fray & informed patient. Patient verbalized understanding & asked about bandage present. Told patient she can take it off after a week. Patient verbalized understanding & had no questions.

## 2018-05-07 NOTE — Telephone Encounter (Signed)
I called her to tell her that she does not need a blood thinner after her outpatient procedure. The voicemail box was full.

## 2018-05-13 ENCOUNTER — Telehealth: Payer: Self-pay | Admitting: General Practice

## 2018-05-13 NOTE — Telephone Encounter (Signed)
Patient called and left message on nurse voicemail line stating she had surgery 9/19 and has questions about her symptoms. Called patient and she states since her surgery a week ago she has had a yellowish liquid d/c with light bleeding. Patient denies fever or chills. Patient reports it was concerning to her because the discharge has an odor to it. Patient describes the smell somewhere between a period and what reminds her of BV. Patient was unsure if this was normal or not. Told patient I would reach out to Dr Hulan Fray and let her know the response. Patient verbalized understanding & had no questions.

## 2018-05-14 ENCOUNTER — Inpatient Hospital Stay (HOSPITAL_COMMUNITY): Admission: RE | Admit: 2018-05-14 | Payer: 59 | Source: Ambulatory Visit

## 2018-05-20 ENCOUNTER — Ambulatory Visit (HOSPITAL_COMMUNITY)
Admission: RE | Admit: 2018-05-20 | Discharge: 2018-05-20 | Disposition: A | Discharge status: Home / Self Care | Payer: 59 | Source: Ambulatory Visit | Attending: Obstetrics & Gynecology | Admitting: Obstetrics & Gynecology

## 2018-05-20 ENCOUNTER — Encounter: Payer: Self-pay | Admitting: General Practice

## 2018-05-20 ENCOUNTER — Other Ambulatory Visit: Payer: Self-pay | Admitting: General Practice

## 2018-05-20 DIAGNOSIS — B9689 Other specified bacterial agents as the cause of diseases classified elsewhere: Secondary | ICD-10-CM | POA: Insufficient documentation

## 2018-05-20 DIAGNOSIS — N760 Acute vaginitis: Secondary | ICD-10-CM | POA: Diagnosis not present

## 2018-05-20 MED ORDER — SODIUM CHLORIDE 0.9 % IV SOLN
510.0000 mg | INTRAVENOUS | Status: DC
  Administered 2018-05-20: 510 mg via INTRAVENOUS
  Filled 2018-05-20: qty 17

## 2018-05-20 MED ORDER — METRONIDAZOLE 500 MG PO TABS: 500.0000 mg | ORAL_TABLET | Freq: Two times a day (BID) | ORAL | 0 refills | Status: DC

## 2018-05-27 ENCOUNTER — Telehealth: Payer: Self-pay | Admitting: Family Medicine

## 2018-05-27 ENCOUNTER — Ambulatory Visit (HOSPITAL_COMMUNITY)
Admission: RE | Admit: 2018-05-27 | Discharge: 2018-05-27 | Disposition: A | Discharge status: Home / Self Care | Payer: 59 | Source: Ambulatory Visit | Attending: Obstetrics & Gynecology | Admitting: Obstetrics & Gynecology

## 2018-05-27 DIAGNOSIS — N920 Excessive and frequent menstruation with regular cycle: Secondary | ICD-10-CM | POA: Diagnosis not present

## 2018-05-27 DIAGNOSIS — D649 Anemia, unspecified: Secondary | ICD-10-CM | POA: Diagnosis not present

## 2018-05-27 MED ORDER — SODIUM CHLORIDE 0.9 % IV SOLN
510.0000 mg | INTRAVENOUS | Status: DC
  Administered 2018-05-27: 510 mg via INTRAVENOUS
  Filled 2018-05-27: qty 17

## 2018-05-27 NOTE — Telephone Encounter (Signed)
Called and left a VM for patient to let her know that her FMLA papers are ready and she can pick them up at her convenience along with her payment of $15 if she has not already paid.

## 2018-06-07 ENCOUNTER — Telehealth: Payer: Self-pay | Admitting: *Deleted

## 2018-06-07 NOTE — Telephone Encounter (Signed)
Received a phone call from [place] stating that he is following up on paperwork faxed 06/03/18.States information needed to make a decision. If not received , call [phone] re: claim #[id number]

## 2018-06-17 ENCOUNTER — Encounter: Payer: Self-pay | Admitting: Obstetrics & Gynecology

## 2018-06-17 ENCOUNTER — Ambulatory Visit (INDEPENDENT_AMBULATORY_CARE_PROVIDER_SITE_OTHER): Payer: 59 | Admitting: Obstetrics & Gynecology

## 2018-06-17 VITALS — BP 111/74 | HR 78 | Ht 66.0 in | Wt 217.6 lb

## 2018-06-17 DIAGNOSIS — Z800 Family history of malignant neoplasm of digestive organs: Secondary | ICD-10-CM | POA: Diagnosis not present

## 2018-06-17 DIAGNOSIS — N898 Other specified noninflammatory disorders of vagina: Secondary | ICD-10-CM | POA: Diagnosis not present

## 2018-06-17 DIAGNOSIS — Z113 Encounter for screening for infections with a predominantly sexual mode of transmission: Secondary | ICD-10-CM

## 2018-06-17 DIAGNOSIS — Z8051 Family history of malignant neoplasm of kidney: Secondary | ICD-10-CM | POA: Diagnosis not present

## 2018-06-17 DIAGNOSIS — Z0000 Encounter for general adult medical examination without abnormal findings: Secondary | ICD-10-CM | POA: Diagnosis not present

## 2018-06-17 DIAGNOSIS — Z9889 Other specified postprocedural states: Secondary | ICD-10-CM

## 2018-06-17 DIAGNOSIS — Z124 Encounter for screening for malignant neoplasm of cervix: Secondary | ICD-10-CM

## 2018-06-17 DIAGNOSIS — Z1151 Encounter for screening for human papillomavirus (HPV): Secondary | ICD-10-CM

## 2018-06-17 DIAGNOSIS — Z8049 Family history of malignant neoplasm of other genital organs: Secondary | ICD-10-CM | POA: Diagnosis not present

## 2018-06-17 LAB — CBC
HEMATOCRIT: 37.8 % (ref 34.0–46.6)
HEMOGLOBIN: 11.4 g/dL (ref 11.1–15.9)
MCH: 23 pg — ABNORMAL LOW (ref 26.6–33.0)
MCHC: 30.2 g/dL — AB (ref 31.5–35.7)
MCV: 76 fL — ABNORMAL LOW (ref 79–97)
Platelets: 320 10*3/uL (ref 150–450)
RBC: 4.96 x10E6/uL (ref 3.77–5.28)
WBC: 7.7 10*3/uL (ref 3.4–10.8)

## 2018-06-17 MED ORDER — IBUPROFEN 800 MG PO TABS: 800.0000 mg | ORAL_TABLET | Freq: Three times a day (TID) | ORAL | 6 refills | Status: DC | PRN

## 2018-06-17 NOTE — Addendum Note (Signed)
Addended by: Dolores Hoose on: 06/17/2018 11:41 AM   Modules accepted: Orders

## 2018-06-17 NOTE — Progress Notes (Signed)
   Subjective:    Patient ID: Bonnie Myers, female    DOB: 06-Jun-1981, 37 y.o.   MRN: [id number]  HPI  37 yo lady here for a post op visit. She had a diagnostic laparoscopy for pelvic pain and possible right dermoid as well as a d&c and Minerva endometrial ablation for DUB/uterine polyp. She reports a yellowish discharge since surgery, had light bleeding with no clots for 3 weeks post op. She has not had sex since surgery.  Review of Systems She uses condoms for contraception. She has uterine and colon cancer in her FH.    Objective:   Physical Exam Breathing, conversing, and ambulating normally Well nourished, well hydrated Black female, no apparent distress Breast exam- normal bilaterally Cervix- normal, yellowish discharge (wet prep sent)       Assessment & Plan:  Preventative care- pap smear with cotesting She declines a flu vaccine Post op state- reassurance given regarding the fact that the Minerva procedure takes several months to reach its full effectiveness My Risk genetic testing today Check CBC- she is now s/p feraheme x 3 doses due to a previous hemoglobin of 7.7

## 2018-06-17 NOTE — Addendum Note (Signed)
Addended by: Emily Filbert on: 06/17/2018 10:28 AM   Modules accepted: Orders

## 2018-06-18 LAB — CERVICOVAGINAL ANCILLARY ONLY
Bacterial vaginitis: NEGATIVE
CANDIDA VAGINITIS: NEGATIVE
Chlamydia: NEGATIVE
Neisseria Gonorrhea: NEGATIVE
TRICH (WINDOWPATH): NEGATIVE

## 2018-06-21 LAB — CYTOLOGY - PAP
ADEQUACY: ABSENT
Diagnosis: NEGATIVE
HPV: NOT DETECTED

## 2018-06-28 ENCOUNTER — Telehealth: Payer: Self-pay | Admitting: *Deleted

## 2018-06-28 NOTE — Telephone Encounter (Signed)
Received a voicemail from 06/25/18 2:52pm stating she is Larene Beach from [employer] calling re: a patient of Dr.Dove and they they tried to send  Her request for short term disability by fax but it wouldn't go thru so they mailed it 06/15/18 and wanted to confirm we received it. When we call refererence claim #[id number]

## 2018-08-02 ENCOUNTER — Encounter (HOSPITAL_COMMUNITY): Payer: Self-pay

## 2018-08-02 ENCOUNTER — Ambulatory Visit (HOSPITAL_COMMUNITY)
Admission: EM | Admit: 2018-08-02 | Discharge: 2018-08-02 | Disposition: A | Discharge status: Home / Self Care | Payer: 59 | Attending: Internal Medicine | Admitting: Internal Medicine

## 2018-08-02 ENCOUNTER — Ambulatory Visit (INDEPENDENT_AMBULATORY_CARE_PROVIDER_SITE_OTHER): Payer: 59

## 2018-08-02 DIAGNOSIS — R0789 Other chest pain: Secondary | ICD-10-CM | POA: Insufficient documentation

## 2018-08-02 DIAGNOSIS — R079 Chest pain, unspecified: Secondary | ICD-10-CM | POA: Diagnosis not present

## 2018-08-02 MED ORDER — CYCLOBENZAPRINE HCL 10 MG PO TABS: 10.0000 mg | ORAL_TABLET | Freq: Two times a day (BID) | ORAL | 0 refills | Status: DC | PRN

## 2018-08-02 NOTE — ED Triage Notes (Signed)
Pt present chest/ shoulder pain on the right side.  Pt has tried OTC medication but no relief.

## 2018-08-02 NOTE — ED Provider Notes (Signed)
[facility]    CSN: [id number] Arrival date & time: 08/02/18  1357     History   Chief Complaint Chief Complaint  Patient presents with  . Chest Pain  . Shoulder Pain    HPI Bonnie Myers is a 37 y.o. female.   The patient with past medical history of pulmonary embolus 12 years ago presents to the urgent care complaining of right-sided chest pain.  The patient states her pain began 4 days ago and hurts with sharp movement, cough or deep inspiration.  She denies fevers or shortness of breath.  She states that she has taken ibuprofen and Percocet left over from previous surgery but neither has relieved the pain.     Past Medical History:  Diagnosis Date  . Anemia   . Asthma   . Bronchitis   . Headache   . HSV infection   . Neuromuscular disorder ([place])    right - carpal tunnel  . Pulmonary embolus ([place])    [place] - at 23 yrs ago, no problems since  . Seasonal allergies   . SVD (spontaneous vaginal delivery)    x 2    Patient Active Problem List   Diagnosis Date Noted  . Anemia 04/01/2018  . Menorrhagia with regular cycle 04/01/2018  . Dysmenorrhea 04/01/2018  . Pulmonary embolus [facility])     Past Surgical History:  Procedure Laterality Date  . CARPAL TUNNEL RELEASE Left 2019  . DILATION AND CURETTAGE OF UTERUS     MAB  . DILITATION & CURRETTAGE/HYSTROSCOPY WITH HYDROTHERMAL ABLATION N/A 05/06/2018   Procedure: DILATATION & CURETTAGE WITH MINERVA  ABLATION;  Surgeon: Emily Filbert, MD;  Location: [facility];  Service: Gynecology;  Laterality: N/A;  . FINGER SURGERY     right hand 4th/ring finger  . LAPAROSCOPIC OVARIAN CYSTECTOMY Right 05/06/2018   Procedure: LAPAROSCOPY DIAGNOSTIC WITH BIOPSY;  Surgeon: Emily Filbert, MD;  Location: [facility];  Service: Gynecology;  Laterality: Right;  . THERAPEUTIC ABORTION     x 2 recvd anesthesia   . TUBAL LIGATION     2006  . WISDOM TOOTH EXTRACTION      OB History    Gravida  2   Para  2   Term      Preterm        AB      Living  2     SAB      TAB      Ectopic      Multiple      Live Births  2            Home Medications    Prior to Admission medications   Medication Sig Start Date End Date Taking? Authorizing Provider  albuterol (VENTOLIN HFA) 108 (90 Base) MCG/ACT inhaler Inhale 2 puffs into the lungs every 4 (four) hours as needed for wheezing or shortness of breath.     [provider]  Aspirin-Caffeine (BAYER BACK & BODY PO) Take 2 tablets by mouth 2 (two) times daily as needed.    [provider]  ibuprofen (ADVIL,MOTRIN) 800 MG tablet Take 1 tablet (800 mg total) by mouth every 8 (eight) hours as needed. 05/07/18   Emily Filbert, MD  ibuprofen (ADVIL,MOTRIN) 800 MG tablet Take 1 tablet (800 mg total) by mouth every 8 (eight) hours as needed. 06/17/18   Emily Filbert, MD  metroNIDAZOLE (FLAGYL) 500 MG tablet Take 1 tablet (500 mg total) by mouth 2 (two) times daily.  05/20/18   Dove, Wilhemina Cash, MD  montelukast (SINGULAIR) 10 MG tablet Take 20 mg by mouth every morning.     [provider]    Family History History reviewed. No pertinent family history.  Social History Social History   Tobacco Use  . Smoking status: Never Smoker  . Smokeless tobacco: Never Used  Substance Use Topics  . Alcohol use: Yes    Alcohol/week: 3.0 standard drinks    Types: 3 Standard drinks or equivalent per week    Comment: wine/liquor   . Drug use: Not Currently    Types: Marijuana    Comment: Hs marijuana - last use in early 20s     Allergies   Penicillins; Azithromycin; Clindamycin/lincomycin; Coumadin [warfarin]; Diflucan [fluconazole]; and Naproxen   Review of Systems Review of Systems  Constitutional: Negative for chills and fever.  HENT: Negative for sore throat and tinnitus.   Eyes: Negative for redness.  Respiratory: Negative for cough and shortness of breath.   Cardiovascular: Positive for chest pain (right lateral chest wall and posterior chest).  Negative for palpitations.  Gastrointestinal: Negative for abdominal pain, diarrhea, nausea and vomiting.  Genitourinary: Negative for dysuria, frequency and urgency.  Musculoskeletal: Negative for myalgias.  Skin: Negative for rash.       No lesions  Neurological: Negative for weakness.  Hematological: Does not bruise/bleed easily.  Psychiatric/Behavioral: Negative for suicidal ideas.     Physical Exam Triage Vital Signs ED Triage Vitals  Enc Vitals Group     BP 08/02/18 1535 118/63     Pulse Rate 08/02/18 1535 83     Resp 08/02/18 1535 16     Temp 08/02/18 1535 98 F (36.7 C)     Temp Source 08/02/18 1535 Oral     SpO2 08/02/18 1535 100 %     Weight --      Height --      Head Circumference --      Peak Flow --      Pain Score 08/02/18 1536 10     Pain Loc --      Pain Edu? --      Excl. in [place]? --    No data found.  Updated Vital Signs BP 118/63 (BP Location: Left Arm)   Pulse 83   Temp 98 F (36.7 C) (Oral)   Resp 16   LMP 07/19/2018   SpO2 100%   Visual Acuity Right Eye Distance:   Left Eye Distance:   Bilateral Distance:    Right Eye Near:   Left Eye Near:    Bilateral Near:     Physical Exam Vitals signs and nursing note reviewed.  Constitutional:      General: She is not in acute distress.    Appearance: She is well-developed.  HENT:     Head: Normocephalic and atraumatic.  Eyes:     General: No scleral icterus.    Conjunctiva/sclera: Conjunctivae normal.     Pupils: Pupils are equal, round, and reactive to light.  Neck:     Musculoskeletal: Normal range of motion and neck supple.     Thyroid: No thyromegaly.     Vascular: No JVD.     Trachea: No tracheal deviation.  Cardiovascular:     Rate and Rhythm: Normal rate and regular rhythm.     Heart sounds: Normal heart sounds. No murmur. No friction rub. No gallop.   Pulmonary:     Effort: Pulmonary effort is normal.     Breath  sounds: Normal breath sounds.  Chest:     Chest wall:  Tenderness (Intercostal/latissimus dorsi/subscapular of right side) present.  Abdominal:     General: Bowel sounds are normal. There is no distension.     Palpations: Abdomen is soft.     Tenderness: There is no abdominal tenderness.  Musculoskeletal: Normal range of motion.  Lymphadenopathy:     Cervical: No cervical adenopathy.  Skin:    General: Skin is warm and dry.  Neurological:     Mental Status: She is alert and oriented to person, place, and time.     Cranial Nerves: No cranial nerve deficit.  Psychiatric:        Behavior: Behavior normal.        Thought Content: Thought content normal.        Judgment: Judgment normal.      UC Treatments / Results  Labs (all labs ordered are listed, but only abnormal results are displayed) Labs Reviewed - No data to display  EKG None  Radiology No results found.  Procedures Procedures (including critical care time)  Medications Ordered in UC Medications - No data to display  Initial Impression / Assessment and Plan / UC Course  I have reviewed the triage vital signs and the nursing notes.  Pertinent labs & imaging results that were available during my care of the patient were reviewed by me and considered in my medical decision making (see chart for details).     Noncardiac chest pain.  Pain is reproducible making it likely musculoskeletal in origin.  However the patient does have history of pulmonary embolism as well as surgery for dermoid uterine cyst 3 months ago.  She has not been immobilized nor does she have swelling of her lower extremities, tachycardia or shortness of breath.  Notably, she has a history of miscarriage.  I have encouraged her to follow-up with her primary care doctor for potential hypercoagulable work-up or to go to the emergency department if she develops worsening chest pain or shortness of breath.  Final Clinical Impressions(s) / UC Diagnoses   Final diagnoses:  None   Discharge Instructions     None    ED Prescriptions    None     Controlled Substance Prescriptions [place] Controlled Substance Registry consulted? Not Applicable   Harrie Foreman, MD 08/02/18 1718

## 2018-08-31 ENCOUNTER — Ambulatory Visit (INDEPENDENT_AMBULATORY_CARE_PROVIDER_SITE_OTHER): Payer: 59

## 2018-08-31 ENCOUNTER — Encounter: Payer: Self-pay | Admitting: Podiatry

## 2018-08-31 ENCOUNTER — Ambulatory Visit (INDEPENDENT_AMBULATORY_CARE_PROVIDER_SITE_OTHER): Payer: 59 | Admitting: Podiatry

## 2018-08-31 VITALS — BP 103/59 | HR 97 | Resp 16

## 2018-08-31 DIAGNOSIS — N943 Premenstrual tension syndrome: Secondary | ICD-10-CM | POA: Insufficient documentation

## 2018-08-31 DIAGNOSIS — K625 Hemorrhage of anus and rectum: Secondary | ICD-10-CM | POA: Insufficient documentation

## 2018-08-31 DIAGNOSIS — M2011 Hallux valgus (acquired), right foot: Secondary | ICD-10-CM | POA: Diagnosis not present

## 2018-08-31 DIAGNOSIS — E669 Obesity, unspecified: Secondary | ICD-10-CM | POA: Insufficient documentation

## 2018-08-31 DIAGNOSIS — R1011 Right upper quadrant pain: Secondary | ICD-10-CM | POA: Insufficient documentation

## 2018-08-31 DIAGNOSIS — B009 Herpesviral infection, unspecified: Secondary | ICD-10-CM | POA: Insufficient documentation

## 2018-08-31 NOTE — Patient Instructions (Signed)
Pre-Operative Instructions  Congratulations, you have decided to take an important step towards improving your quality of life.  You can be assured that the doctors and staff at [facility] will be with you every step of the way.  Here are some important things you should know:  1. Plan to be at the surgery center/hospital at least 1 (one) hour prior to your scheduled time, unless otherwise directed by the surgical center/hospital staff.  You must have a responsible adult accompany you, remain during the surgery and drive you home.  Make sure you have directions to the surgical center/hospital to ensure you arrive on time. 2. If you are having surgery at [facility] or [facility] hospitals, you will need a copy of your medical history and physical form from your family physician within one month prior to the date of surgery. We will give you a form for your primary physician to complete.  3. We make every effort to accommodate the date you request for surgery.  However, there are times where surgery dates or times have to be moved.  We will contact you as soon as possible if a change in schedule is required.   4. No aspirin/ibuprofen for one week before surgery.  If you are on aspirin, any non-steroidal anti-inflammatory medications (Mobic, Aleve, Ibuprofen) should not be taken seven (7) days prior to your surgery.  You make take Tylenol for pain prior to surgery.  5. Medications - If you are taking daily heart and blood pressure medications, seizure, reflux, allergy, asthma, anxiety, pain or diabetes medications, make sure you notify the surgery center/hospital before the day of surgery so they can tell you which medications you should take or avoid the day of surgery. 6. No food or drink after midnight the night before surgery unless directed otherwise by surgical center/hospital staff. 7. No alcoholic beverages 24-hours prior to surgery.  No smoking 24-hours prior or 24-hours after  surgery. 8. Wear loose pants or shorts. They should be loose enough to fit over bandages, boots, and casts. 9. Don't wear slip-on shoes. Sneakers are preferred. 10. Bring your boot with you to the surgery center/hospital.  Also bring crutches or a walker if your physician has prescribed it for you.  If you do not have this equipment, it will be provided for you after surgery. 11. If you have not been contacted by the surgery center/hospital by the day before your surgery, call to confirm the date and time of your surgery. 12. Leave-time from work may vary depending on the type of surgery you have.  Appropriate arrangements should be made prior to surgery with your employer. 13. Prescriptions will be provided immediately following surgery by your doctor.  Fill these as soon as possible after surgery and take the medication as directed. Pain medications will not be refilled on weekends and must be approved by the doctor. 14. Remove nail polish on the operative foot and avoid getting pedicures prior to surgery. 15. Wash the night before surgery.  The night before surgery wash the foot and leg well with water and the antibacterial soap provided. Be sure to pay special attention to beneath the toenails and in between the toes.  Wash for at least three (3) minutes. Rinse thoroughly with water and dry well with a towel.  Perform this wash unless told not to do so by your physician.  Enclosed: 1 Ice pack (please put in freezer the night before surgery)   1 Hibiclens skin cleaner     Pre-op instructions  If you have any questions regarding the instructions, please do not hesitate to call our office.  [place]: [address], [place], [place] [address] -- [phone]  [place]: [address] [place], [place] [address] -- [phone]  [place]: [address]  [place], [place] [address] -- [phone]  [place]: [address], [address], [place], [place] [address] -- [phone]  Website:  [email or web address] 

## 2018-09-01 NOTE — Progress Notes (Signed)
Subjective:  Patient ID: Bonnie Myers, female    DOB: 06/24/1981,  MRN: [id number] HPI Chief Complaint  Patient presents with  . Foot Pain    1st MPJ right - aching and pinching sensations x years, getting worse, shoes are getting uncomfortable, callused areas plantar forefoot - tried change in shoes - no help  . New Patient (Initial Visit)    38 y.o. female presents with the above complaint.   ROS: Denies fever chills nausea vomiting muscle aches pains calf pain back pain chest pain shortness of breath.  Past Medical History:  Diagnosis Date  . Anemia   . Asthma   . Bronchitis   . Headache   . HSV infection   . Neuromuscular disorder ([place])    right - carpal tunnel  . Pulmonary embolus ([place])    [place] - at 23 yrs ago, no problems since  . Seasonal allergies   . SVD (spontaneous vaginal delivery)    x 2   Past Surgical History:  Procedure Laterality Date  . CARPAL TUNNEL RELEASE Left 2019  . DILATION AND CURETTAGE OF UTERUS     MAB  . DILITATION & CURRETTAGE/HYSTROSCOPY WITH HYDROTHERMAL ABLATION N/A 05/06/2018   Procedure: DILATATION & CURETTAGE WITH MINERVA  ABLATION;  Surgeon: Emily Filbert, MD;  Location: [facility];  Service: Gynecology;  Laterality: N/A;  . FINGER SURGERY     right hand 4th/ring finger  . LAPAROSCOPIC OVARIAN CYSTECTOMY Right 05/06/2018   Procedure: LAPAROSCOPY DIAGNOSTIC WITH BIOPSY;  Surgeon: Emily Filbert, MD;  Location: [facility];  Service: Gynecology;  Laterality: Right;  . THERAPEUTIC ABORTION     x 2 recvd anesthesia   . TUBAL LIGATION     2006  . WISDOM TOOTH EXTRACTION      Current Outpatient Medications:  .  albuterol (VENTOLIN HFA) 108 (90 Base) MCG/ACT inhaler, Inhale 2 puffs into the lungs every 4 (four) hours as needed for wheezing or shortness of breath. , Disp: , Rfl:  .  cyclobenzaprine (FLEXERIL) 10 MG tablet, Take 1 tablet (10 mg total) by mouth 2 (two) times daily as needed for muscle spasms., Disp: 20 tablet, Rfl: 0 .  ibuprofen  (ADVIL,MOTRIN) 800 MG tablet, Take 1 tablet (800 mg total) by mouth every 8 (eight) hours as needed., Disp: 60 tablet, Rfl: 6 .  montelukast (SINGULAIR) 10 MG tablet, Take 20 mg by mouth every morning. , Disp: , Rfl:   Allergies  Allergen Reactions  . Penicillins Hives  . Azithromycin Hives  . Clindamycin/Lincomycin Hives  . Coumadin [Warfarin] Hives  . Diflucan [Fluconazole] Hives  . Naproxen Hives   Review of Systems Objective:   Vitals:   08/31/18 1423  BP: (!) 103/59  Pulse: 97  Resp: 16    General: Well developed, nourished, in no acute distress, alert and oriented x3   Dermatological: Skin is warm, dry and supple bilateral. Nails x 10 are well maintained; remaining integument appears unremarkable at this time. There are no open sores, no preulcerative lesions, no rash or signs of infection present.  Reactive hyperkeratotic lesions plantar aspect of the second third metatarsal phalangeal joints of fifth bilateral.  No open lesions or wounds are noted.  Vascular: Dorsalis Pedis artery and Posterior Tibial artery pedal pulses are 2/4 bilateral with immedate capillary fill time. Pedal hair growth present. No varicosities and no lower extremity edema present bilateral.   Neruologic: Grossly intact via light touch bilateral. Vibratory intact via tuning fork bilateral. Protective threshold with Jobie Quaker  monofilament intact to all pedal sites bilateral. Patellar and Achilles deep tendon reflexes 2+ bilateral. No Babinski or clonus noted bilateral.   Musculoskeletal: No gross boney pedal deformities bilateral. No pain, crepitus, or limitation noted with foot and ankle range of motion bilateral. Muscular strength 5/5 in all groups tested bilateral.  She has pain on palpation and range of motion of the first metatarsal phalangeal joint of the right foot minimally so on the left.  Mild tailor's bunion deformity bilateral plantarflexed second third metatarsal resulting reactive  hyperkeratotic lesion no open lesions or wounds are noted.  Gait: Unassisted, Nonantalgic.    Radiographs:  Radiographs taken today demonstrate an increase in the first intermetatarsal angle with an elongated second and third metatarsals.  Mild tailor's bunion deformities.  No osteoarthritic changes at the level of the first metatarsal phalangeal joint.  Right worse than left.  Assessment & Plan:   Assessment: Hallux valgus bilaterally right greater than left plantarflexed second third metatarsal bilateral right greater than left, mild tailor's bunion deformity fifth bilateral  Plan: Discussed etiology pathology conservative surgical therapies at this point went ahead and schedule her for surgical intervention consisting of an [facility] bunion repair with screw fixation right foot.  We discussed the possible postop complications which may include but not limited to postop pain bleeding swelling infection recurrence need further surgery overcorrection under correction also digit loss of limb loss of life she understands this is amenable to it signed all 3 pages a consent form.  We dispensed information regarding the surgery the surgery center and her preop.  Because of her history of a single blood clot we will err on the side of precaution and prescribe Lovenox postoperatively for at least 2 weeks.     Max T. [place], [place]

## 2018-09-07 ENCOUNTER — Telehealth: Payer: Self-pay | Admitting: *Deleted

## 2018-09-07 NOTE — Telephone Encounter (Signed)
"  I was there today.  I scheduled surgery for March 27.  I'd like to reschedule it to April 3.  Please give me a call."  I left her a message that I changed her surgery date from March 27 to April 3.  I asked her to call if she has any further questions.

## 2018-09-30 ENCOUNTER — Telehealth: Payer: Self-pay | Admitting: *Deleted

## 2018-09-30 NOTE — Telephone Encounter (Signed)
"  I'm scheduled for surgery on April 3.  I think I want to cancel that.  I want to try the other option Dr. Milinda Pointer gave me of getting orthotics, first."  I'll cancel the surgery.  Would you like me to transfer you to a scheduler so you can schedule an appointment for an orthotic casting?  "Yes, I would."  I transferred the call to [facility].  I called Caren Griffins and canceled Jaquanda's surgery that was scheduled for 11/19/2018.

## 2018-10-05 ENCOUNTER — Ambulatory Visit (INDEPENDENT_AMBULATORY_CARE_PROVIDER_SITE_OTHER): Payer: 59 | Admitting: Orthotics

## 2018-10-05 DIAGNOSIS — M79672 Pain in left foot: Secondary | ICD-10-CM | POA: Diagnosis not present

## 2018-10-05 DIAGNOSIS — M2011 Hallux valgus (acquired), right foot: Secondary | ICD-10-CM

## 2018-10-05 NOTE — Progress Notes (Signed)
Patient is here today to be evaluated and cast for CMFO.  Patient has hx of functional hallux limitus (FHL), and needs a supportive orthoses that will plantarflex first ray in order to lower hinge pin of first MPJ and enhance windless effect.  Plan of deep heel cup, hug arch, and reverse mortons extension.  Richy to fab.  

## 2018-11-02 ENCOUNTER — Other Ambulatory Visit: Payer: 59 | Admitting: Orthotics

## 2018-11-17 ENCOUNTER — Encounter: Payer: Self-pay | Admitting: *Deleted

## 2018-12-18 IMAGING — DX DG CHEST 2V
2 series · 2 of 2 positions shown · non-contrast
Comparison: None.

CLINICAL DATA: Right-sided chest and shoulder pain for 4 days.

EXAM:
CHEST - 2 VIEW

[chest pa]
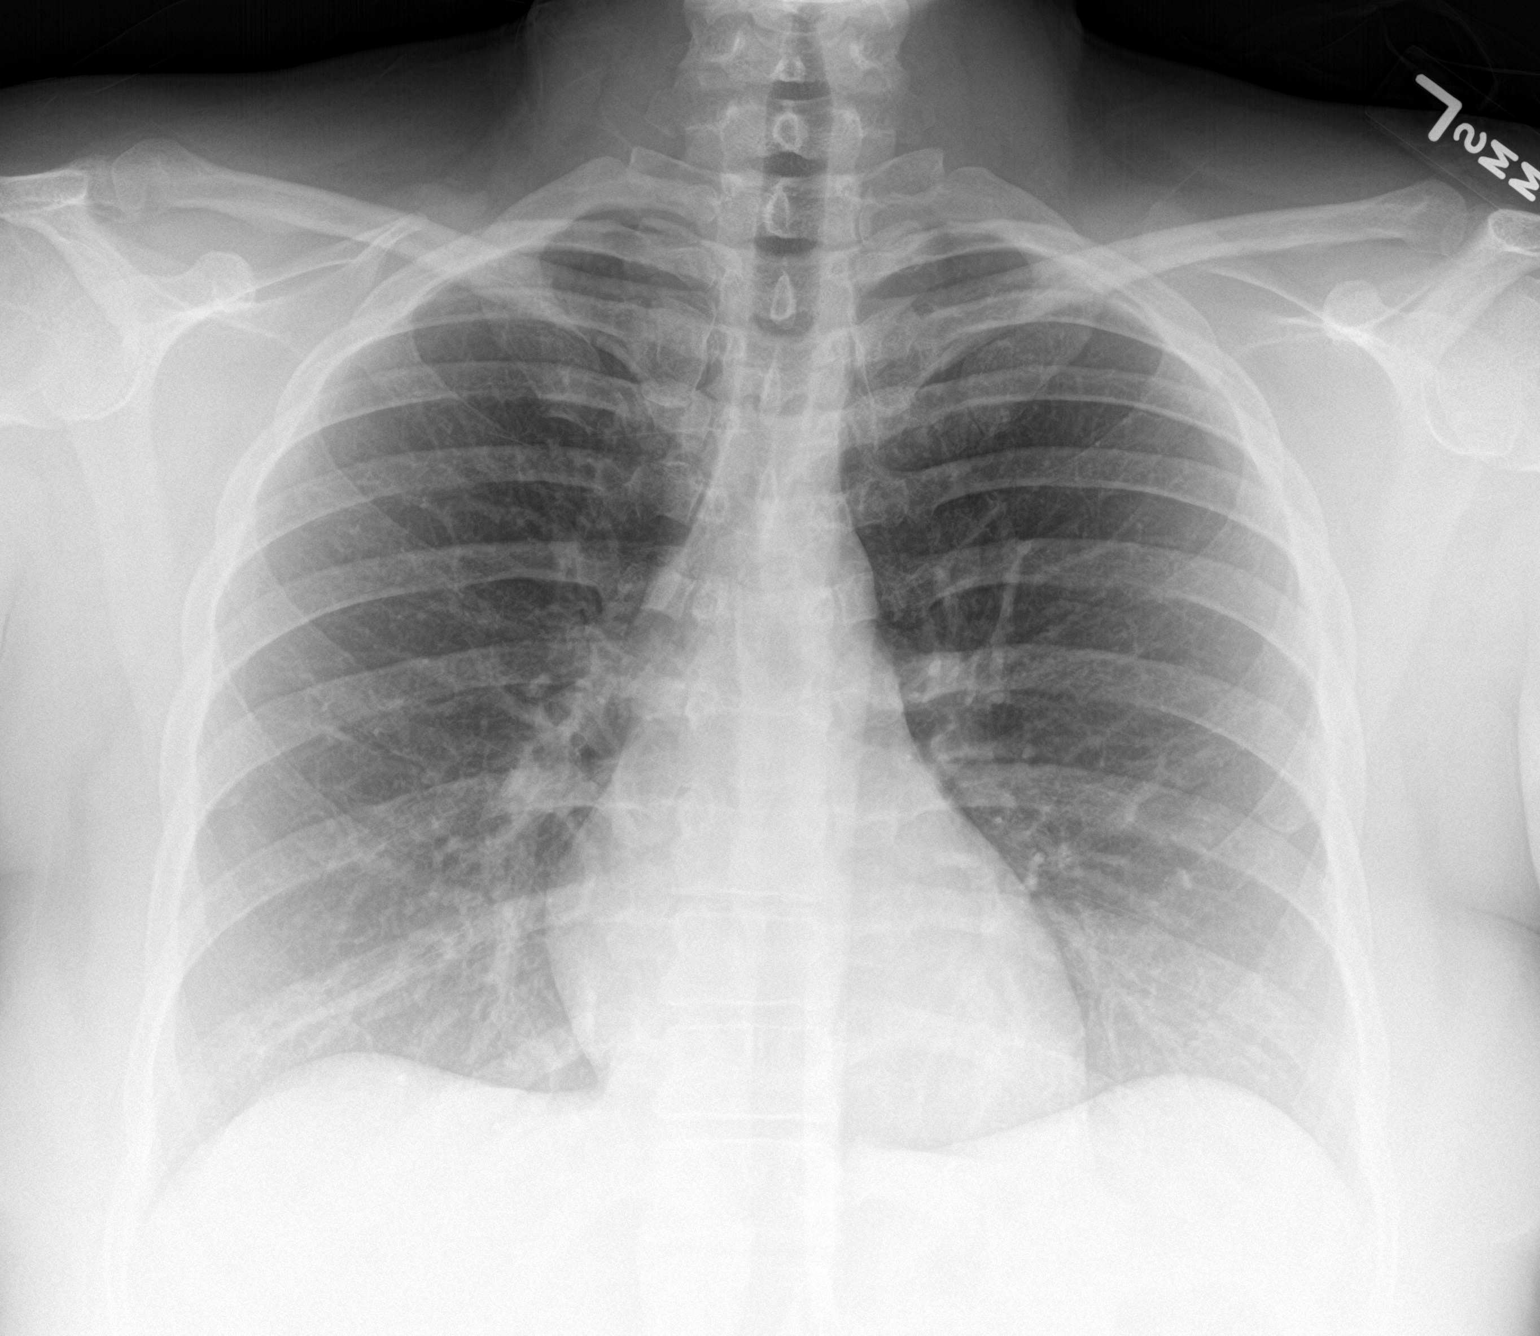

[chest lat]
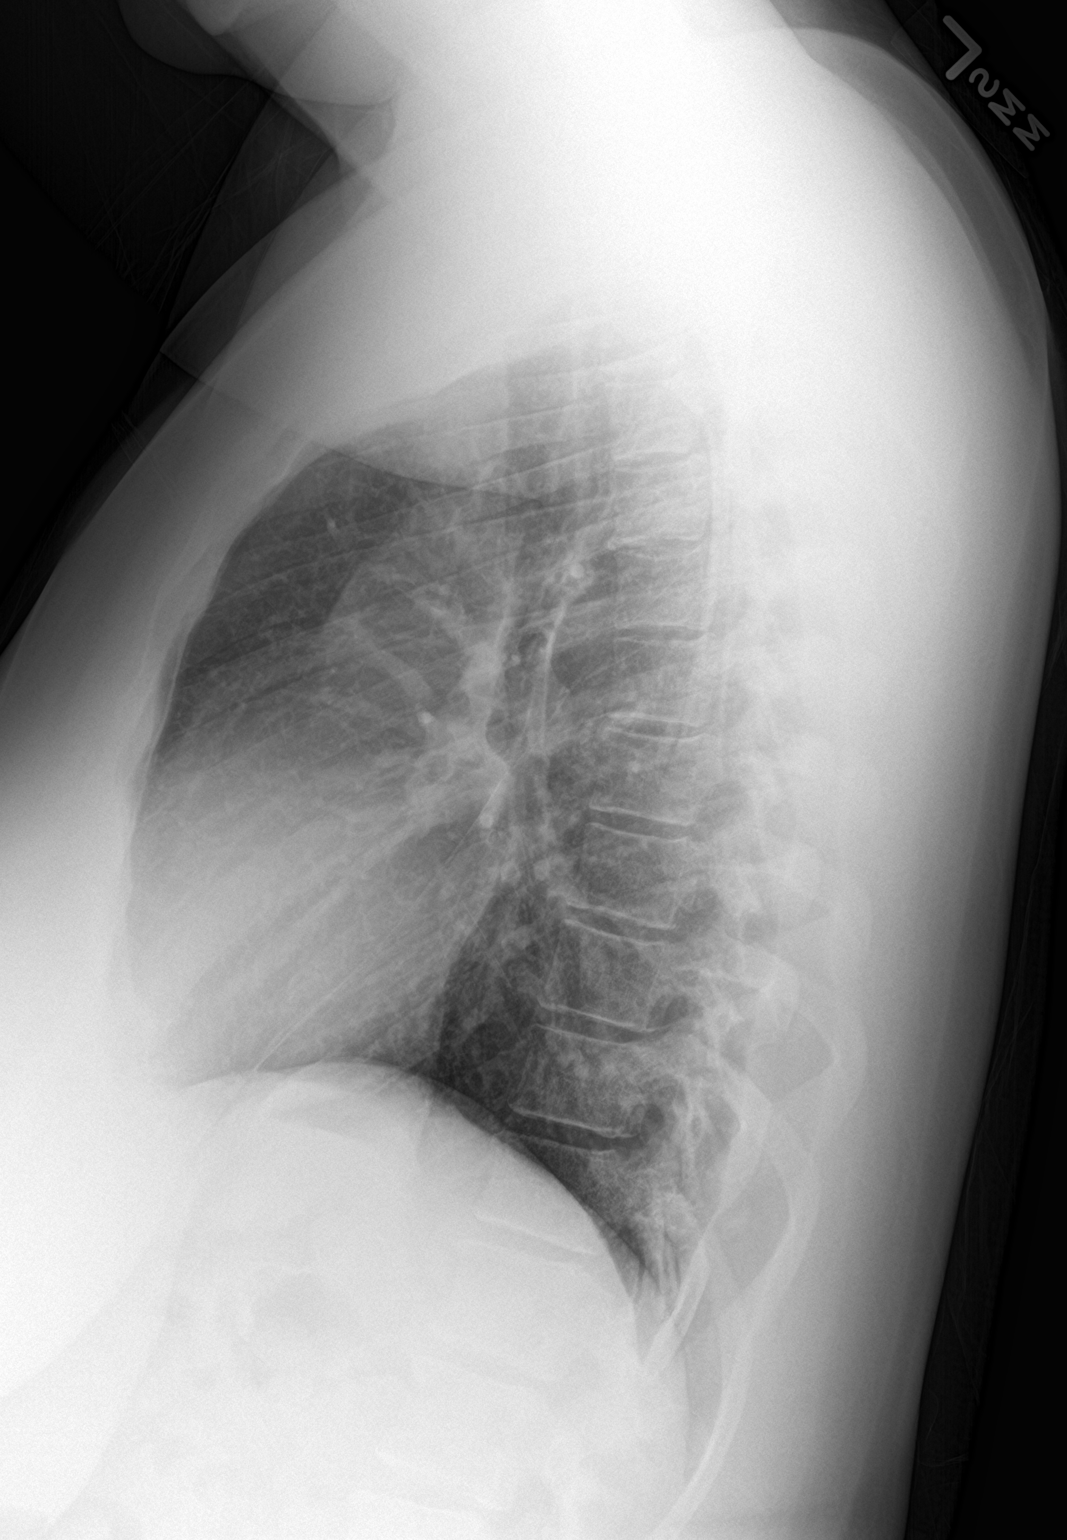

[2 of 2 positions shown; findings below may reference images not displayed]

FINDINGS: The cardiac silhouette, mediastinal and hilar contours are normal.
The lungs are clear. No pleural effusion. The bony thorax is intact.
IMPRESSION: No acute cardiopulmonary findings.

## 2019-11-23 ENCOUNTER — Ambulatory Visit: Payer: 59 | Admitting: Obstetrics & Gynecology

## 2019-11-24 ENCOUNTER — Telehealth: Payer: Self-pay | Admitting: *Deleted

## 2019-11-24 ENCOUNTER — Encounter: Payer: Self-pay | Admitting: Obstetrics and Gynecology

## 2019-11-24 ENCOUNTER — Ambulatory Visit (INDEPENDENT_AMBULATORY_CARE_PROVIDER_SITE_OTHER): Payer: 59 | Admitting: Obstetrics and Gynecology

## 2019-11-24 ENCOUNTER — Other Ambulatory Visit (HOSPITAL_COMMUNITY)
Admission: RE | Admit: 2019-11-24 | Discharge: 2019-11-24 | Disposition: A | Discharge status: Home / Self Care | Payer: 59 | Source: Ambulatory Visit | Attending: Obstetrics and Gynecology | Admitting: Obstetrics and Gynecology

## 2019-11-24 ENCOUNTER — Other Ambulatory Visit: Payer: Self-pay

## 2019-11-24 VITALS — BP 130/75 | HR 99 | Ht 66.0 in | Wt 223.0 lb

## 2019-11-24 DIAGNOSIS — Z01419 Encounter for gynecological examination (general) (routine) without abnormal findings: Secondary | ICD-10-CM

## 2019-11-24 DIAGNOSIS — Z1272 Encounter for screening for malignant neoplasm of vagina: Secondary | ICD-10-CM

## 2019-11-24 DIAGNOSIS — Z1231 Encounter for screening mammogram for malignant neoplasm of breast: Secondary | ICD-10-CM | POA: Diagnosis not present

## 2019-11-24 NOTE — Progress Notes (Signed)
Subjective:     Bonnie Myers is a 39 y.o. female P2 with BMI 35 who is here for a comprehensive physical exam. The patient reports no problems. She is sexually active using BTL for contraception. She had an endometrial ablation in 2019 and reports having a period at the beginning and end of each month approximately 22-23 days apart. Patient denies pelvic pain or abnormal discharge. She is without any other complaints  Past Medical History:  Diagnosis Date  . Anemia   . Asthma   . Bronchitis   . Headache   . HSV infection   . Neuromuscular disorder ([place])    right - carpal tunnel  . Pul[place]us ([place])    [place] at 23 yrs ago, no problems since  . Seasonal allergies   . SVD (spontaneous vaginal delivery)    x 2    Past Surgical History:  Procedure Laterality Date  . CARPAL TUNNEL RELEASE Left 2019  . DILATION AND CURETTAGE OF UTERUS     MAB  . DILITATION & CURRETTAGE/HYSTROSCOPY WITH HYDROTHERMAL ABLATION N/A 05/06/2018   Procedure: DILATATION & CURETTAGE WITH MINERVA  ABLATION;  Surgeon: Emily Filbert, MD;  Location: [facility];  Service: Gynecology;  Laterality: N/A;  . FINGER SURGERY     right hand 4th/ring finger  . LAPAROSCOPIC OVARIAN CYSTECTOMY Right 05/06/2018   Procedure: LAPAROSCOPY DIAGNOSTIC WITH BIOPSY;  Surgeon: Emily Filbert, MD;  Location: [facility];  Service: Gynecology;  Laterality: Right;  . THERAPEUTIC ABORTION     x 2 recvd anesthesia   . TUBAL LIGATION     2006  . WISDOM TOOTH EXTRACTION     Family History  Problem Relation Age of Onset  . Colon cancer Paternal Grandmother   . Hypertension Father   . Diabetes Father   . Endometrial cancer Mother   . Diabetes Mother   . Hypertension Mother      Social History   Socioeconomic History  . Marital status: Single    Spouse name: Not on file  . Number of children: 2  . Years of education: Not on file  . Highest education level: Not on file  Occupational History  . Not on file  Tobacco Use  . Smoking  status: Never Smoker  . Smokeless tobacco: Never Used  Substance and Sexual Activity  . Alcohol use: Yes    Alcohol/week: 3.0 standard drinks    Types: 3 Standard drinks or equivalent per week    Comment: wine/liquor   . Drug use: Not Currently    Types: Marijuana    Comment: Hs marijuana - last use in early 20s  . Sexual activity: Not Currently    Birth control/protection: Surgical    Comment: tubal  Other Topics Concern  . Not on file  Social History Narrative  . Not on file   Social Determinants of Health   Financial Resource Strain:   . Difficulty of Paying Living Expenses:   Food Insecurity:   . Worried About Charity fundraiser in the Last Year:   . Arboriculturist in the Last Year:   Transportation Needs:   . Film/video editor (Medical):   Marland Kitchen Lack of Transportation (Non-Medical):   Physical Activity:   . Days of Exercise per Week:   . Minutes of Exercise per Session:   Stress:   . Feeling of Stress :   Social Connections:   . Frequency of Communication with Friends and Family:   . Frequency  of Social Gatherings with Friends and Family:   . Attends Religious Services:   . Active Member of Clubs or Organizations:   . Attends Archivist Meetings:   Marland Kitchen Marital Status:   Intimate Partner Violence:   . Fear of Current or Ex-Partner:   . Emotionally Abused:   Marland Kitchen Physically Abused:   . Sexually Abused:    Health Maintenance  Topic Date Due  . HIV Screening  Never done  . TETANUS/TDAP  Never done  . INFLUENZA VACCINE  03/18/2020  . PAP SMEAR-Modifier  06/17/2021       Review of Systems Pertinent items noted in HPI and remainder of comprehensive ROS otherwise negative.   Objective:  Blood pressure 130/75, pulse 99, height 5\' 6"  (1.676 m), weight 223 lb (101.2 kg), last menstrual period 11/07/2019.     GENERAL: Well-developed, well-nourished female in no acute distress.  HEENT: Normocephalic, atraumatic. Sclerae anicteric.  NECK: Supple. Normal  thyroid.  LUNGS: Clear to auscultation bilaterally.  HEART: Regular rate and rhythm. BREASTS: Symmetric in size. No palpable masses or lymphadenopathy, skin changes, or nipple drainage. ABDOMEN: Soft, nontender, nondistended. No organomegaly. PELVIC: Normal external female genitalia. Vagina is pink and rugated.  Normal discharge. Normal appearing cervix. Uterus is normal in size.  No adnexal mass or tenderness. EXTREMITIES: No cyanosis, clubbing, or edema, 2+ distal pulses.    Assessment:    Healthy female exam.      Plan:    pap smear collected Health maintenance labs Pelvic ultrasound ordered  Screening mammogram ordered Patient will be contacted with abnormal results Patient advised to keep a menstrual calendar and to return with cycle length shorter than 21 days See After Visit Summary for Counseling Recommendations

## 2019-11-24 NOTE — Telephone Encounter (Signed)
Not able to leave patient a message about insurance.

## 2019-11-25 LAB — HEMOGLOBIN A1C
Hgb A1c MFr Bld: 6.1 % of total Hgb — ABNORMAL HIGH (ref <5.7)
Mean Plasma Glucose: 128 (calc)
eAG (mmol/L): 7.1 (calc)

## 2019-11-25 LAB — COMPREHENSIVE METABOLIC PANEL
AG Ratio: 1.3 (calc) (ref 1.0–2.5)
ALT: 9 U/L (ref 6–29)
AST: 12 U/L (ref 10–30)
Albumin: 3.9 g/dL (ref 3.6–5.1)
Alkaline phosphatase (APISO): 140 U/L — ABNORMAL HIGH (ref 31–125)
BUN: 11 mg/dL (ref 7–25)
CO2: 29 mmol/L (ref 20–32)
Calcium: 9 mg/dL (ref 8.6–10.2)
Chloride: 102 mmol/L (ref 98–110)
Creat: 0.78 mg/dL (ref 0.50–1.10)
Globulin: 2.9 g/dL (calc) (ref 1.9–3.7)
Glucose, Bld: 106 mg/dL — ABNORMAL HIGH (ref 65–99)
Potassium: 3.9 mmol/L (ref 3.5–5.3)
Sodium: 138 mmol/L (ref 135–146)
Total Bilirubin: 0.1 mg/dL — ABNORMAL LOW (ref 0.2–1.2)
Total Protein: 6.8 g/dL (ref 6.1–8.1)

## 2019-11-25 LAB — CBC
HCT: 37.1 % (ref 35.0–45.0)
Hemoglobin: 11.2 g/dL — ABNORMAL LOW (ref 11.7–15.5)
MCH: 22.8 pg — ABNORMAL LOW (ref 27.0–33.0)
MCHC: 30.2 g/dL — ABNORMAL LOW (ref 32.0–36.0)
MCV: 75.4 fL — ABNORMAL LOW (ref 80.0–100.0)
MPV: 9.7 fL (ref 7.5–12.5)
Platelets: 381 10*3/uL (ref 140–400)
RBC: 4.92 10*6/uL (ref 3.80–5.10)
RDW: 17.3 % — ABNORMAL HIGH (ref 11.0–15.0)
WBC: 11.4 10*3/uL — ABNORMAL HIGH (ref 3.8–10.8)

## 2019-11-25 LAB — TSH: TSH: 2.39 mIU/L

## 2019-11-28 LAB — CYTOLOGY - PAP
Adequacy: ABSENT
Comment: NEGATIVE
Diagnosis: NEGATIVE
High risk HPV: NEGATIVE

## 2019-12-01 ENCOUNTER — Ambulatory Visit (INDEPENDENT_AMBULATORY_CARE_PROVIDER_SITE_OTHER): Payer: 59

## 2019-12-01 ENCOUNTER — Other Ambulatory Visit: Payer: Self-pay

## 2019-12-01 ENCOUNTER — Other Ambulatory Visit: Payer: Self-pay | Admitting: Obstetrics and Gynecology

## 2019-12-01 DIAGNOSIS — Z01419 Encounter for gynecological examination (general) (routine) without abnormal findings: Secondary | ICD-10-CM

## 2019-12-02 ENCOUNTER — Other Ambulatory Visit: Payer: Self-pay | Admitting: Obstetrics and Gynecology

## 2019-12-02 DIAGNOSIS — R928 Other abnormal and inconclusive findings on diagnostic imaging of breast: Secondary | ICD-10-CM

## 2019-12-09 ENCOUNTER — Other Ambulatory Visit: Payer: Self-pay

## 2019-12-09 ENCOUNTER — Other Ambulatory Visit: Payer: Self-pay | Admitting: Obstetrics and Gynecology

## 2019-12-09 ENCOUNTER — Ambulatory Visit
Admission: RE | Admit: 2019-12-09 | Discharge: 2019-12-09 | Disposition: A | Discharge status: Home / Self Care | Payer: 59 | Source: Ambulatory Visit | Attending: Obstetrics and Gynecology | Admitting: Obstetrics and Gynecology

## 2019-12-09 DIAGNOSIS — R928 Other abnormal and inconclusive findings on diagnostic imaging of breast: Secondary | ICD-10-CM

## 2019-12-09 DIAGNOSIS — R921 Mammographic calcification found on diagnostic imaging of breast: Secondary | ICD-10-CM

## 2020-06-11 ENCOUNTER — Other Ambulatory Visit: Payer: Self-pay | Admitting: Obstetrics and Gynecology

## 2020-06-11 ENCOUNTER — Other Ambulatory Visit: Payer: Self-pay

## 2020-06-11 ENCOUNTER — Ambulatory Visit
Admission: RE | Admit: 2020-06-11 | Discharge: 2020-06-11 | Disposition: A | Discharge status: Home / Self Care | Payer: No Typology Code available for payment source | Source: Ambulatory Visit | Attending: Obstetrics and Gynecology | Admitting: Obstetrics and Gynecology

## 2020-06-11 DIAGNOSIS — R921 Mammographic calcification found on diagnostic imaging of breast: Secondary | ICD-10-CM

## 2020-12-03 ENCOUNTER — Ambulatory Visit
Admission: RE | Admit: 2020-12-03 | Discharge: 2020-12-03 | Disposition: A | Discharge status: Home / Self Care | Payer: No Typology Code available for payment source | Source: Ambulatory Visit | Attending: Obstetrics and Gynecology | Admitting: Obstetrics and Gynecology

## 2020-12-03 ENCOUNTER — Other Ambulatory Visit: Payer: Self-pay

## 2020-12-03 DIAGNOSIS — R921 Mammographic calcification found on diagnostic imaging of breast: Secondary | ICD-10-CM

## 2021-01-07 ENCOUNTER — Telehealth: Payer: Self-pay | Admitting: Obstetrics and Gynecology

## 2021-01-07 NOTE — Telephone Encounter (Signed)
Open in error

## 2021-01-29 DIAGNOSIS — G43009 Migraine without aura, not intractable, without status migrainosus: Secondary | ICD-10-CM | POA: Insufficient documentation

## 2021-01-29 DIAGNOSIS — H93A3 Pulsatile tinnitus, bilateral: Secondary | ICD-10-CM | POA: Insufficient documentation

## 2021-02-11 ENCOUNTER — Ambulatory Visit: Payer: No Typology Code available for payment source | Admitting: Obstetrics and Gynecology

## 2021-04-25 ENCOUNTER — Other Ambulatory Visit: Payer: Self-pay

## 2021-04-25 ENCOUNTER — Other Ambulatory Visit (HOSPITAL_COMMUNITY)
Admission: RE | Admit: 2021-04-25 | Discharge: 2021-04-25 | Disposition: A | Discharge status: Home / Self Care | Payer: No Typology Code available for payment source | Source: Ambulatory Visit | Attending: Obstetrics and Gynecology | Admitting: Obstetrics and Gynecology

## 2021-04-25 ENCOUNTER — Encounter: Payer: Self-pay | Admitting: Obstetrics and Gynecology

## 2021-04-25 ENCOUNTER — Ambulatory Visit (INDEPENDENT_AMBULATORY_CARE_PROVIDER_SITE_OTHER): Payer: No Typology Code available for payment source | Admitting: Obstetrics and Gynecology

## 2021-04-25 VITALS — BP 118/73 | HR 96 | Resp 16 | Ht 66.0 in | Wt 232.0 lb

## 2021-04-25 DIAGNOSIS — Z01419 Encounter for gynecological examination (general) (routine) without abnormal findings: Secondary | ICD-10-CM | POA: Insufficient documentation

## 2021-04-25 DIAGNOSIS — Z7689 Persons encountering health services in other specified circumstances: Secondary | ICD-10-CM | POA: Diagnosis not present

## 2021-04-25 MED ORDER — IBUPROFEN 800 MG PO TABS: 800.0000 mg | ORAL_TABLET | Freq: Three times a day (TID) | ORAL | 3 refills | Status: DC | PRN

## 2021-04-25 NOTE — Progress Notes (Signed)
iSubjective:     Bonnie Myers is a 40 y.o. female P2 with BMI 37 who is here for a comprehensive physical exam. The patient reports no problems. She reports a monthly period lasting 3-5 days associated with cramping pain the first 2 days of her cycle. Patient is sexually active without concerns. She denies urinary incontinence. She denies pelvic pain or abnormal discharge. Patient is without any other complaints.   Past Medical History:  Diagnosis Date   Anemia    Asthma    Bronchitis    Headache    HSV infection    Neuromuscular disorder ([place])    right - carpal tunnel   P[place]us ([place])    [place] 23 [place]rs ago, no problems since   Seasonal allergies    SVD (spontaneous vaginal delivery)    x 2   Past Surgical History:  Procedure Laterality Date   CARPAL TUNNEL RELEASE Left 2019   DILATION AND CURETTAGE OF UTERUS     MAB   DILITATION & CURRETTAGE/HYSTROSCOPY WITH HYDROTHERMAL ABLATION N/A 05/06/2018   Procedure: DILATATION & CURETTAGE WITH MINERVA  ABLATION;  Surgeon: Emily Filbert, MD;  Location: [facility];  Service: Gynecology;  Laterality: N/A;   FINGER SURGERY     right hand 4th/ring finger   LAPAROSCOPIC OVARIAN CYSTECTOMY Right 05/06/2018   Procedure: LAPAROSCOPY DIAGNOSTIC WITH BIOPSY;  Surgeon: Emily Filbert, MD;  Location: [facility];  Service: Gynecology;  Laterality: Right;   THERAPEUTIC ABORTION     x 2 recvd anesthesia    TUBAL LIGATION     2006   WISDOM TOOTH EXTRACTION     Family History  Problem Relation Age of Onset   Colon cancer Paternal Grandmother    Hypertension Father    Diabetes Father    Endometrial cancer Mother    Diabetes Mother    Hypertension Mother     Social History   Socioeconomic History   Marital status: Single    Spouse name: Not on file   Number of children: 2   Years of education: Not on file   Highest education level: Not on file  Occupational History   Not on file  Tobacco Use   Smoking status: Never   Smokeless tobacco:  Never  Vaping Use   Vaping Use: Never used  Substance and Sexual Activity   Alcohol use: Yes    Alcohol/week: 3.0 standard drinks    Types: 3 Standard drinks or equivalent per week    Comment: wine/liquor    Drug use: Not Currently    Types: Marijuana    Comment: Hs marijuana - last use in early 20s   Sexual activity: Not Currently    Birth control/protection: Surgical    Comment: tubal  Other Topics Concern   Not on file  Social History Narrative   Not on file   Social Determinants of Health   Financial Resource Strain: Not on file  Food Insecurity: Not on file  Transportation Needs: Not on file  Physical Activity: Not on file  Stress: Not on file  Social Connections: Not on file  Intimate Partner Violence: Not on file   Health Maintenance  Topic Date Due   COVID-19 Vaccine (1) Never done   HIV Screening  Never done   Hepatitis C Screening  Never done   TETANUS/TDAP  Never done   INFLUENZA VACCINE  Never done   PAP SMEAR-Modifier  11/24/2022   Pneumococcal Vaccine 9-70 Years old  Aged Out  HPV VACCINES  Aged Out       Review of Systems Pertinent items noted in HPI and remainder of comprehensive ROS otherwise negative.   Objective:  Blood pressure 118/73, pulse 96, resp. rate 16, height '5\' 6"'$  (1.676 m), weight 232 lb (105.2 kg), last menstrual period 04/05/2021.   GENERAL: Well-developed, well-nourished female in no acute distress.  HEENT: Normocephalic, atraumatic. Sclerae anicteric.  NECK: Supple. Normal thyroid.  LUNGS: Clear to auscultation bilaterally.  HEART: Regular rate and rhythm. BREASTS: Symmetric in size. No palpable masses or lymphadenopathy, skin changes, or nipple drainage. ABDOMEN: Soft, nontender, nondistended. No organomegaly. PELVIC: Normal external female genitalia. Vagina is pink and rugated.  Normal discharge. Normal appearing cervix. Uterus is normal in size. No adnexal mass or tenderness. Chaperone present during the pelvic  exam EXTREMITIES: No cyanosis, clubbing, or edema, 2+ distal pulses.     Assessment:    Healthy female exam.      Plan:    Pap smear collected Patient had a normal screening mammogram in April 2022 Patient will be contacted with abnormal results See After Visit Summary for Counseling Recommendations

## 2021-05-02 LAB — CYTOLOGY - PAP: Diagnosis: NEGATIVE

## 2021-06-03 ENCOUNTER — Ambulatory Visit: Payer: No Typology Code available for payment source | Admitting: Medical-Surgical

## 2021-06-04 ENCOUNTER — Encounter: Payer: Self-pay | Admitting: Medical-Surgical

## 2021-06-04 ENCOUNTER — Ambulatory Visit (INDEPENDENT_AMBULATORY_CARE_PROVIDER_SITE_OTHER): Payer: No Typology Code available for payment source | Admitting: Medical-Surgical

## 2021-06-04 ENCOUNTER — Other Ambulatory Visit: Payer: Self-pay

## 2021-06-04 VITALS — BP 117/75 | HR 85 | Resp 20 | Ht 66.0 in | Wt 229.0 lb

## 2021-06-04 DIAGNOSIS — Z0000 Encounter for general adult medical examination without abnormal findings: Secondary | ICD-10-CM

## 2021-06-04 DIAGNOSIS — Z1329 Encounter for screening for other suspected endocrine disorder: Secondary | ICD-10-CM

## 2021-06-04 DIAGNOSIS — Z7689 Persons encountering health services in other specified circumstances: Secondary | ICD-10-CM | POA: Diagnosis not present

## 2021-06-04 DIAGNOSIS — R7303 Prediabetes: Secondary | ICD-10-CM | POA: Diagnosis not present

## 2021-06-04 NOTE — Progress Notes (Signed)
New Patient Office Visit  Subjective:  Patient ID: Bonnie Myers, female    DOB: 10/16/1980  Age: 40 y.o. MRN: [id number]  CC:  Chief Complaint  Patient presents with   Establish Care    HPI Bonnie Myers presents to establish care. She is a pleasant 40 year old female who presents with several health concerns and health related anxiety. She does have several chronic conditions that she manages well but notes that there are a number of issues that she would like addressed. Feels like there are so many small issues that have been going on for a while that she is really not sure where to start. Today, she reports she is due for her annual physical and associated blood work. After discussion, she would like to proceed with that then start to follow up on some of her problems/concerns.  Dentist: last about 1 year ago, no concerns Eye exam: a few years ago, no correction although she did receive a prescription for glasses Exercise: not as much as she did when she lived in the city Diet: has been cutting back on bread which is her weakness Last mammogram about 6-12 months ago, close follow up Sees OBGYN for women's health needs  Past Medical History:  Diagnosis Date   Allergy    Anemia    Anxiety    Asthma    Bronchitis    Depression    Headache    HSV infection    Neuromuscular disorder ([place])    right - carpal tunnel   Pulmonary embolus ([place])    [place] - at 23 yrs ago, no problems since   Seasonal allergies    SVD (spontaneous vaginal delivery)    x 2    Past Surgical History:  Procedure Laterality Date   CARPAL TUNNEL RELEASE Left 2019   DILATION AND CURETTAGE OF UTERUS     MAB   DILITATION & CURRETTAGE/HYSTROSCOPY WITH HYDROTHERMAL ABLATION N/A 05/06/2018   Procedure: DILATATION & CURETTAGE WITH MINERVA  ABLATION;  Surgeon: Emily Filbert, MD;  Location: [facility];  Service: Gynecology;  Laterality: N/A;   FINGER SURGERY     right hand 4th/ring finger   FRACTURE SURGERY      Right ring finger   LAPAROSCOPIC OVARIAN CYSTECTOMY Right 05/06/2018   Procedure: LAPAROSCOPY DIAGNOSTIC WITH BIOPSY;  Surgeon: Emily Filbert, MD;  Location: [facility];  Service: Gynecology;  Laterality: Right;   THERAPEUTIC ABORTION     x 2 recvd anesthesia    TUBAL LIGATION     2006   WISDOM TOOTH EXTRACTION      Family History  Problem Relation Age of Onset   Colon cancer Paternal Grandmother    Diabetes Paternal Grandmother    Hypertension Father    Diabetes Father    Stroke Father    Endometrial cancer Mother    Diabetes Mother    Hypertension Mother    Cancer Mother    Diabetes Maternal Grandmother    Diabetes Paternal Grandfather    Vision loss Paternal Grandfather    Asthma Sister    Diabetes Sister    Miscarriages / Stillbirths Sister    Diabetes Maternal Uncle    Diabetes Maternal Uncle    Stroke Maternal Uncle     Social History   Socioeconomic History   Marital status: Single    Spouse name: Not on file   Number of children: 2   Years of education: Not on file   Highest education level: Not on file  Occupational History   Not on file  Tobacco Use   Smoking status: Never   Smokeless tobacco: Never  Vaping Use   Vaping Use: Never used  Substance and Sexual Activity   Alcohol use: Yes    Alcohol/week: 3.0 standard drinks    Types: 3 Standard drinks or equivalent per week    Comment: wine/liquor    Drug use: Not Currently    Types: Marijuana    Comment: Hs marijuana - last use in early 20s   Sexual activity: Not Currently    Birth control/protection: Abstinence, Surgical    Comment: tubal  Other Topics Concern   Not on file  Social History Narrative   Not on file   Social Determinants of Health   Financial Resource Strain: Not on file  Food Insecurity: Not on file  Transportation Needs: Not on file  Physical Activity: Not on file  Stress: Not on file  Social Connections: Not on file  Intimate Partner Violence: Not on file    ROS Review of  Systems  Constitutional:  Negative for chills, fatigue, fever and unexpected weight change.  HENT:  Positive for tinnitus (pulsatile). Negative for congestion, rhinorrhea, sinus pressure and sore throat.   Eyes:  Positive for visual disturbance.  Respiratory:  Negative for cough, chest tightness and shortness of breath.   Cardiovascular:  Negative for chest pain, palpitations and leg swelling.  Gastrointestinal:  Positive for constipation. Negative for abdominal pain, diarrhea, nausea and vomiting.  Endocrine: Negative for cold intolerance and heat intolerance.  Genitourinary:  Negative for dysuria, frequency, urgency, vaginal bleeding and vaginal discharge.  Musculoskeletal:  Positive for back pain.  Skin:  Negative for rash and wound.  Neurological:  Positive for headaches. Negative for dizziness and light-headedness.  Hematological:  Does not bruise/bleed easily.  Psychiatric/Behavioral:  Positive for dysphoric mood and sleep disturbance. Negative for self-injury and suicidal ideas. The patient is nervous/anxious.    Objective:   Today's Vitals: BP 117/75 (BP Location: Left Arm, Patient Position: Sitting, Cuff Size: Large)   Pulse 85   Resp 20   Ht 5\' 6"  (1.676 m)   Wt 229 lb (103.9 kg)   SpO2 97%   BMI 36.96 kg/m   Physical Exam Vitals reviewed.  Constitutional:      General: She is not in acute distress.    Appearance: Normal appearance. She is not ill-appearing.  HENT:     Head: Normocephalic and atraumatic.     Right Ear: Tympanic membrane, ear canal and external ear normal. There is no impacted cerumen.     Left Ear: Tympanic membrane, ear canal and external ear normal. There is no impacted cerumen.  Eyes:     Extraocular Movements: Extraocular movements intact.     Conjunctiva/sclera: Conjunctivae normal.     Pupils: Pupils are equal, round, and reactive to light.  Neck:     Thyroid: No thyromegaly.     Vascular: No carotid bruit or JVD.     Trachea: Trachea normal.   Cardiovascular:     Rate and Rhythm: Normal rate and regular rhythm.     Pulses: Normal pulses.     Heart sounds: Normal heart sounds. No murmur heard.   No friction rub. No gallop.  Pulmonary:     Effort: Pulmonary effort is normal. No respiratory distress.     Breath sounds: Normal breath sounds. No wheezing.  Abdominal:     General: Bowel sounds are normal. There is no distension.  Palpations: Abdomen is soft.     Tenderness: There is no abdominal tenderness. There is no guarding.  Musculoskeletal:        General: Normal range of motion.     Cervical back: Normal range of motion and neck supple.  Skin:    General: Skin is warm and dry.  Neurological:     Mental Status: She is alert and oriented to person, place, and time.     Cranial Nerves: No cranial nerve deficit.  Psychiatric:        Mood and Affect: Mood normal.        Behavior: Behavior normal.        Thought Content: Thought content normal.        Judgment: Judgment normal.    Assessment & Plan:   1. Encounter to establish care Reviewed available information and discussed care concerns with patient.   2. Prediabetes Checking hemoglobin A1c.  - Hemoglobin A1c  3. Annual physical exam Checking labs. Wellness recommendations provided with AVS. - CBC with Differential/Platelet - COMPLETE METABOLIC PANEL WITH GFR - Lipid panel  4. Thyroid disorder screen Checking TSH. - TSH  Outpatient Encounter Medications as of 06/04/2021  Medication Sig   aspirin 81 MG EC tablet Take by mouth.   ELDERBERRY PO Take by mouth daily.   fexofenadine (ALLEGRA) 60 MG tablet Take 60 mg by mouth 2 (two) times daily.   FIBER ADULT GUMMIES PO Take by mouth daily.   montelukast (SINGULAIR) 10 MG tablet Take 20 mg by mouth every morning.    Probiotic Product (PROBIOTIC PO) Take by mouth daily.   [DISCONTINUED] albuterol (VENTOLIN HFA) 108 (90 Base) MCG/ACT inhaler Inhale 2 puffs into the lungs every 4 (four) hours as needed for  wheezing or shortness of breath.    [DISCONTINUED] ibuprofen (ADVIL) 800 MG tablet Take 1 tablet (800 mg total) by mouth every 8 (eight) hours as needed.   [DISCONTINUED] ibuprofen (ADVIL,MOTRIN) 800 MG tablet Take 1 tablet (800 mg total) by mouth every 8 (eight) hours as needed.   No facility-administered encounter medications on file as of 06/04/2021.    Follow-up: Return in about 4 weeks (around 07/02/2021) for mood/tinnitus follow up.   Clearnce Sorrel, DNP, APRN, FNP-BC [facility]

## 2021-06-07 LAB — COMPLETE METABOLIC PANEL WITH GFR
AG Ratio: 1.2 (calc) (ref 1.0–2.5)
ALT: 9 U/L (ref 6–29)
AST: 11 U/L (ref 10–30)
Albumin: 3.9 g/dL (ref 3.6–5.1)
Alkaline phosphatase (APISO): 142 U/L — ABNORMAL HIGH (ref 31–125)
BUN: 11 mg/dL (ref 7–25)
CO2: 28 mmol/L (ref 20–32)
Calcium: 9.1 mg/dL (ref 8.6–10.2)
Chloride: 104 mmol/L (ref 98–110)
Creat: 0.81 mg/dL (ref 0.50–0.99)
Globulin: 3.2 g/dL (calc) (ref 1.9–3.7)
Glucose, Bld: 103 mg/dL — ABNORMAL HIGH (ref 65–99)
Potassium: 4.4 mmol/L (ref 3.5–5.3)
Sodium: 139 mmol/L (ref 135–146)
Total Bilirubin: 0.3 mg/dL (ref 0.2–1.2)
Total Protein: 7.1 g/dL (ref 6.1–8.1)
eGFR: 93 mL/min/{1.73_m2} (ref >=60)

## 2021-06-07 LAB — CBC WITH DIFFERENTIAL/PLATELET
Absolute Monocytes: 576 cells/uL (ref 200–950)
Basophils Absolute: 29 cells/uL (ref 0–200)
Basophils Relative: 0.3 %
Eosinophils Absolute: 250 cells/uL (ref 15–500)
Eosinophils Relative: 2.6 %
HCT: 36.6 % (ref 35.0–45.0)
Hemoglobin: 10.8 g/dL — ABNORMAL LOW (ref 11.7–15.5)
Lymphs Abs: 3264 cells/uL (ref 850–3900)
MCH: 21.6 pg — ABNORMAL LOW (ref 27.0–33.0)
MCHC: 29.5 g/dL — ABNORMAL LOW (ref 32.0–36.0)
MCV: 73.3 fL — ABNORMAL LOW (ref 80.0–100.0)
MPV: 9.4 fL (ref 7.5–12.5)
Monocytes Relative: 6 %
Neutro Abs: 5482 cells/uL (ref 1500–7800)
Neutrophils Relative %: 57.1 %
Platelets: 380 10*3/uL (ref 140–400)
RBC: 4.99 10*6/uL (ref 3.80–5.10)
RDW: 17.5 % — ABNORMAL HIGH (ref 11.0–15.0)
Total Lymphocyte: 34 %
WBC: 9.6 10*3/uL (ref 3.8–10.8)

## 2021-06-07 LAB — LIPID PANEL
Cholesterol: 192 mg/dL (ref <200)
HDL: 42 mg/dL — ABNORMAL LOW (ref >=50)
LDL Cholesterol (Calc): 123 mg/dL (calc) — ABNORMAL HIGH
Non-HDL Cholesterol (Calc): 150 mg/dL (calc) — ABNORMAL HIGH (ref <130)
Total CHOL/HDL Ratio: 4.6 (calc) (ref <5.0)
Triglycerides: 157 mg/dL — ABNORMAL HIGH (ref <150)

## 2021-06-07 LAB — IRON,TIBC AND FERRITIN PANEL
%SAT: 7 % (calc) — ABNORMAL LOW (ref 16–45)
Ferritin: 14 ng/mL — ABNORMAL LOW (ref 16–232)
Iron: 25 ug/dL — ABNORMAL LOW (ref 40–190)
TIBC: 384 mcg/dL (calc) (ref 250–450)

## 2021-06-07 LAB — HEMOGLOBIN A1C
Hgb A1c MFr Bld: 6.6 % of total Hgb — ABNORMAL HIGH (ref <5.7)
Mean Plasma Glucose: 143 mg/dL
eAG (mmol/L): 7.9 mmol/L

## 2021-06-07 LAB — TSH: TSH: 2.4 mIU/L

## 2021-06-10 ENCOUNTER — Encounter: Payer: Self-pay | Admitting: Medical-Surgical

## 2021-06-10 DIAGNOSIS — E611 Iron deficiency: Secondary | ICD-10-CM

## 2021-06-12 ENCOUNTER — Telehealth: Payer: Self-pay | Admitting: *Deleted

## 2021-06-12 NOTE — Telephone Encounter (Signed)
Per referral - Dr. Charna Archer - called and gave upcoming appointments - mailed welcome packet with calendar

## 2021-07-03 ENCOUNTER — Other Ambulatory Visit: Payer: Self-pay

## 2021-07-03 ENCOUNTER — Ambulatory Visit (INDEPENDENT_AMBULATORY_CARE_PROVIDER_SITE_OTHER): Payer: No Typology Code available for payment source | Admitting: Medical-Surgical

## 2021-07-03 ENCOUNTER — Encounter: Payer: Self-pay | Admitting: Medical-Surgical

## 2021-07-03 VITALS — BP 119/80 | HR 102 | Resp 20 | Ht 66.0 in | Wt 228.0 lb

## 2021-07-03 DIAGNOSIS — H93A1 Pulsatile tinnitus, right ear: Secondary | ICD-10-CM

## 2021-07-03 DIAGNOSIS — F418 Other specified anxiety disorders: Secondary | ICD-10-CM | POA: Diagnosis not present

## 2021-07-03 DIAGNOSIS — E119 Type 2 diabetes mellitus without complications: Secondary | ICD-10-CM

## 2021-07-03 DIAGNOSIS — H93291 Other abnormal auditory perceptions, right ear: Secondary | ICD-10-CM

## 2021-07-03 DIAGNOSIS — R454 Irritability and anger: Secondary | ICD-10-CM

## 2021-07-03 MED ORDER — METFORMIN HCL ER 500 MG PO TB24: 500.0000 mg | ORAL_TABLET | Freq: Every day | ORAL | 11 refills | Status: DC

## 2021-07-03 NOTE — Patient Instructions (Signed)

## 2021-07-03 NOTE — Progress Notes (Signed)
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  Most days she is affected by the pulsatile tinnitus and it is very aggravating and annoying.  She has been evaluated by 2 different ENT providers, one several years ago and one earlier this year.  They were unable to find any reasons for her tinnitus and recommended she see neurology.  She notes that no imaging was done but they did do auditory exams.  She is not having any sinus congestion and has not had any hearing loss.  She has tried using Flonase in the past but is not tolerant to this.  She does take Allegra daily as needed but takes her Singulair every day.  Mood-notes that she has been very irritable lately and describes her mood as "over it" most days.  She was previously doing counseling but stepped away from this for a while.  She does still have the relationship with a Social worker.  Hesitant to consider medications due to possible side effects.  Has a lot of anxiety regarding health and possible actions versus reactions when it comes to medications and recommended treatments.  Diabetes-hemoglobin A1c checked about 4 weeks ago showed progression to type 2 diabetes with hemoglobin A1c of 6.6%.  She has been checking her sugars regularly and notes her fasting sugars between 90 and 147.  Notes the 147 reading was after she had orange juice late the night before.  Although worried about possible medication effects, she would like to go ahead and get started on something to help manage her sugars rather than wait to see what it is when we check it next time.  I reviewed the past medical history, family history, social history, surgical history, and allergies today and no changes were needed.  Please see the problem list section below in epic for further details.   Physical  exam:   General: Well Developed, well nourished, and in no acute distress.  Neuro: Alert and oriented x3.  HEENT: Normocephalic, atraumatic.  Skin: Warm and dry. Cardiac: Regular rate and rhythm, no murmurs rubs or gallops, no lower extremity edema.  Respiratory: Clear to auscultation bilaterally. Not using accessory muscles, speaking in full sentences.  Impression and Recommendations:    1. Controlled type 2 diabetes mellitus without complication, without long-term current use of insulin ([place]) Discussed the nature of diabetes in relation to dietary intake.  Provided information regarding carb counting in the setting of type 2 diabetes.  Since she would like to go ahead and start a medication, sending in metformin 500 mg XR daily.  Discussed the possibility of side effects with this medication and what to expect.  If this is not tolerable, we would likely see if she can be placed on Rybelsus since she is not interested in injections.  2. Abnormal auditory perception of right ear 3. Pulsatile tinnitus of right ear Several years of pulsatile tinnitus with no imaging.  Ordering MRI of the brain with and without contrast for further evaluation.  Ultimately, we will likely need to get her in with neurology but she would like to have what work-up she can without having to wreck up medical bills or pay extra for specialists at this point. - MR BRAIN W WO CONTRAST; Future  4. Anxiety about health 5. Irritability Discussed options for treatment.  She would like  to proceed with getting in touch with her counselor and getting started with therapy prior to considering adding another medication.  If therapy does not seem to be helping, she knows that medication is always an option and will reach out to me for that.  Return in about 3 months (around 10/03/2021) for DM follow up. ___________________________________________ Clearnce Sorrel, DNP, APRN, FNP-BC [facility]

## 2021-07-05 ENCOUNTER — Other Ambulatory Visit: Payer: No Typology Code available for payment source

## 2021-07-05 ENCOUNTER — Encounter: Payer: No Typology Code available for payment source | Admitting: Family

## 2021-07-08 ENCOUNTER — Other Ambulatory Visit: Payer: No Typology Code available for payment source

## 2021-07-15 ENCOUNTER — Other Ambulatory Visit: Payer: Self-pay

## 2021-07-15 ENCOUNTER — Ambulatory Visit (INDEPENDENT_AMBULATORY_CARE_PROVIDER_SITE_OTHER): Payer: No Typology Code available for payment source

## 2021-07-15 DIAGNOSIS — R519 Headache, unspecified: Secondary | ICD-10-CM

## 2021-07-15 DIAGNOSIS — H93291 Other abnormal auditory perceptions, right ear: Secondary | ICD-10-CM

## 2021-07-15 DIAGNOSIS — R42 Dizziness and giddiness: Secondary | ICD-10-CM

## 2021-07-15 DIAGNOSIS — H93A1 Pulsatile tinnitus, right ear: Secondary | ICD-10-CM

## 2021-07-15 MED ORDER — GADOBUTROL 1 MMOL/ML IV SOLN
10.0000 mL | Freq: Once | INTRAVENOUS | Status: AC | PRN
  Administered 2021-07-15: 11:00:00 10 mL via INTRAVENOUS

## 2021-07-31 ENCOUNTER — Encounter: Payer: Self-pay | Admitting: Medical-Surgical

## 2021-08-14 ENCOUNTER — Telehealth: Payer: Self-pay

## 2021-08-14 NOTE — Telephone Encounter (Signed)
Received Epic notification that pt has not read MyChart message from Samuel Bouche sent on 07/31/2021.  LVM requesting that pt return call or she may review message thru Mychart and respond.  Charyl Bigger, CMA

## 2021-08-15 NOTE — Telephone Encounter (Signed)
LVM requesting that pt return call in reference to Joy's questions or respond thru [place].  Charyl Bigger, CMA

## 2021-08-16 NOTE — Telephone Encounter (Signed)
Noted! Thank you

## 2021-08-16 NOTE — Telephone Encounter (Signed)
Spoke with pt who stated that she is doing "ok" and is taking the metformin daily now. She states that the nausea has resolved and she has modified her diet to low sugar/carbs, however she has not been checking her BS at home.  She also feels like her appetite has increased since starting metformin and finds herself needing to eat every 2-3 hours.  Charyl Bigger, CMA

## 2021-12-15 ENCOUNTER — Encounter: Payer: Self-pay | Admitting: Medical-Surgical

## 2021-12-16 MED ORDER — MONTELUKAST SODIUM 10 MG PO TABS: 10.0000 mg | ORAL_TABLET | ORAL | 0 refills | Status: DC

## 2022-01-16 ENCOUNTER — Other Ambulatory Visit: Payer: Self-pay | Admitting: Medical-Surgical

## 2022-01-29 ENCOUNTER — Other Ambulatory Visit: Payer: Self-pay | Admitting: Medical-Surgical

## 2022-01-29 NOTE — Telephone Encounter (Signed)
Patient needs appointment for further refills.  Thank you

## 2022-01-30 NOTE — Telephone Encounter (Signed)
LVM for patient to call back to get appt scheduled for f/u with PCP for any futher med refills. AMUCK

## 2022-02-11 ENCOUNTER — Encounter (HOSPITAL_COMMUNITY): Payer: Self-pay

## 2022-02-11 ENCOUNTER — Ambulatory Visit (HOSPITAL_COMMUNITY)
Admission: EM | Admit: 2022-02-11 | Discharge: 2022-02-11 | Disposition: A | Discharge status: Home / Self Care | Payer: No Typology Code available for payment source

## 2022-02-11 DIAGNOSIS — L989 Disorder of the skin and subcutaneous tissue, unspecified: Secondary | ICD-10-CM | POA: Diagnosis not present

## 2022-02-11 NOTE — ED Provider Notes (Signed)
[facility]    CSN: [id number] Arrival date & time: 02/11/22  1846     History   Chief Complaint Chief Complaint  Patient presents with   Abscess    HPI Bonnie Myers is a 41 y.o. female.  Presents with a spot on her abdomen that she has noticed for the past year. It did not bother her and wasn't tender or painful. 3 days ago she noticed it became inflamed and then "bust open".  It drained a little and she expressed some white/green drainage. Became tender at this time.  No history of abscess.  Denies any fever, chills, abdominal pain, vomiting/diarrhea. No other rash or lesions noted on the body.  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/31/2018   Rectal hemorrhage 08/31/2018   Right upper quadrant pain 08/31/2018   Abnormal auditory perception of right ear 05/03/2018   Dizziness 05/03/2018   Epistaxis 05/03/2018   Jaw pain 05/03/2018   Anemia 04/01/2018   Menorrhagia with regular cycle 04/01/2018   Dysmenorrhea 04/01/2018   Pulmonary embolus ([facility])    Bilateral carpal tunnel syndrome 09/15/2017    Past Surgical History:  Procedure Laterality Date   CARPAL TUNNEL RELEASE Left 2019   DILATION AND CURETTAGE OF UTERUS     MAB   DILITATION & CURRETTAGE/HYSTROSCOPY WITH HYDROTHERMAL ABLATION N/A 05/06/2018   Procedure: DILATATION & CURETTAGE WITH MINERVA  ABLATION;  Surgeon: Allie Bossier, MD;  Location: [facility];  Service: Gynecology;  Laterality: N/A;   FINGER SURGERY     right hand 4th/ring finger   FRACTURE SURGERY     Right  ring finger   LAPAROSCOPIC OVARIAN CYSTECTOMY Right 05/06/2018   Procedure: LAPAROSCOPY DIAGNOSTIC WITH BIOPSY;  Surgeon: Allie Bossier, MD;  Location: [facility];  Service: Gynecology;  Laterality: Right;   THERAPEUTIC ABORTION     x 2 recvd anesthesia    TUBAL LIGATION     2006   WISDOM TOOTH EXTRACTION      OB History     Gravida  2   Para  2   Term      Preterm      AB      Living  2      SAB      IAB      Ectopic      Multiple      Live Births  2            Home Medications    Prior to Admission medications   Medication Sig Start Date End Date Taking? Authorizing Provider  aspirin 81 MG EC tablet Take by mouth.    [provider]  ELDERBERRY PO Take by mouth daily.    [provider]  fexofenadine (ALLEGRA) 60 MG tablet Take 60 mg by mouth 2 (two) times daily.    [provider]  FIBER ADULT GUMMIES PO  Take by mouth daily.    [provider]  metFORMIN (GLUCOPHAGE XR) 500 MG 24 hr tablet Take 1 tablet (500 mg total) by mouth daily with breakfast. 07/03/21 07/03/22  Christen Butter, NP  montelukast (SINGULAIR) 10 MG tablet TAKE 1 TABLET (10 MG TOTAL) BY MOUTH EVERY MORNING. NEEDS APPT FOR FURTHER REFILLS 01/29/22   Christen Butter, NP  Probiotic Product (PROBIOTIC PO) Take by mouth daily.    [provider]    Family History Family History  Problem Relation Age of Onset   Colon cancer Paternal Grandmother    Diabetes Paternal Grandmother    Hypertension Father    Diabetes Father    Stroke Father    Endometrial cancer Mother    Diabetes Mother    Hypertension Mother    Cancer Mother    Diabetes Maternal Grandmother    Diabetes Paternal Grandfather    Vision loss Paternal Grandfather    Asthma Sister    Diabetes Sister    Miscarriages / Stillbirths Sister    Diabetes Maternal Uncle    Diabetes Maternal Uncle    Stroke Maternal Uncle     Social History Social History   Tobacco Use   Smoking status: Never    Smokeless tobacco: Never  Vaping Use   Vaping Use: Never used  Substance Use Topics   Alcohol use: Yes    Alcohol/week: 3.0 standard drinks of alcohol    Types: 3 Standard drinks or equivalent per week    Comment: wine/liquor    Drug use: Not Currently    Types: Marijuana    Comment: Hs marijuana - last use in early 20s     Allergies   Penicillins, Azithromycin, Bee venom, Clindamycin/lincomycin, Coumadin [warfarin], Diflucan [fluconazole], and Naproxen   Review of Systems Review of Systems  Per HPI  Physical Exam Triage Vital Signs ED Triage Vitals  Enc Vitals Group     BP 02/11/22 1903 (!) 126/59     Pulse Rate 02/11/22 1903 80     Resp 02/11/22 1903 16     Temp 02/11/22 1903 98.8 F (37.1 C)     Temp Source 02/11/22 1903 Oral     SpO2 02/11/22 1903 100 %     Weight --      Height --      Head Circumference --      Peak Flow --      Pain Score 02/11/22 1904 6     Pain Loc --      Pain Edu? --      Excl. in GC? --    No data found.  Updated Vital Signs BP (!) 126/59 (BP Location: Right Arm)   Pulse 80   Temp 98.8 F (37.1 C) (Oral)   Resp 16   LMP 01/08/2022 (Approximate)   SpO2 100%    Physical Exam Vitals and nursing note reviewed.  Constitutional:      General: She is not in acute distress.    Appearance: Normal appearance.  HENT:     Mouth/Throat:     Mouth: Mucous membranes are moist.     Pharynx: Oropharynx is clear.  Eyes:     Conjunctiva/sclera: Conjunctivae normal.  Cardiovascular:     Rate and Rhythm: Normal rate and regular rhythm.     Pulses: Normal pulses.     Heart sounds: Normal heart sounds.  Pulmonary:     Effort: Pulmonary effort is normal. No respiratory distress.     Breath sounds: Normal breath  sounds.  Musculoskeletal:        General: Normal range of motion.     Cervical back: Normal range of motion.  Skin:    Findings: Lesion present.     Comments: Small 1 cm lesion on the upper abdomen.  Mildly tender to  palpation.  Not draining.  No warmth or erythema. No capsule felt beneath skin, non fluctuant  Neurological:     Mental Status: She is alert and oriented to person, place, and time.     UC Treatments / Results  Labs (all labs ordered are listed, but only abnormal results are displayed) Labs Reviewed - No data to display  EKG  Radiology No results found.  Procedures Procedures  Medications Ordered in UC Medications - No data to display  Initial Impression / Assessment and Plan / UC Course  I have reviewed the triage vital signs and the nursing notes.  Pertinent labs & imaging results that were available during my care of the patient were reviewed by me and considered in my medical decision making (see chart for details).  Very superficial area of skin that seems to have drained. Do not feel any underlying abscess or deeper infection. At this time she may benefit from topical antibiotic ointment and watch for improvement or signs of infection. She can follow up with primary care if symptoms persist or do not resolve. Return precautions discussed. Patient agrees to plan and is discharged in stable condition.  Final Clinical Impressions(s) / UC Diagnoses   Final diagnoses:  Superficial skin lesion     Discharge Instructions      Apply antibiotic ointment twice daily - I recommend bacitracin.  Watch for any signs of infection.  Please go to the emergency department with any worsening symptoms.     ED Prescriptions   None    PDMP not reviewed this encounter.   Kathrine Haddock 02/11/22 1944

## 2022-02-11 NOTE — ED Triage Notes (Signed)
Pt states she had a sore on her abdomen for the past year and 3 days ago she said it opened up and started draining yellow/green drainage then blood.

## 2022-04-09 ENCOUNTER — Encounter: Payer: No Typology Code available for payment source | Admitting: Medical-Surgical

## 2022-04-15 ENCOUNTER — Encounter: Payer: No Typology Code available for payment source | Admitting: Medical-Surgical

## 2022-05-02 ENCOUNTER — Encounter: Payer: No Typology Code available for payment source | Admitting: Medical-Surgical

## 2022-05-16 ENCOUNTER — Ambulatory Visit (INDEPENDENT_AMBULATORY_CARE_PROVIDER_SITE_OTHER): Payer: No Typology Code available for payment source | Admitting: Medical-Surgical

## 2022-05-16 ENCOUNTER — Encounter: Payer: Self-pay | Admitting: Medical-Surgical

## 2022-05-16 VITALS — BP 112/75 | HR 86 | Resp 20 | Ht 66.0 in | Wt 226.4 lb

## 2022-05-16 DIAGNOSIS — Z0000 Encounter for general adult medical examination without abnormal findings: Secondary | ICD-10-CM

## 2022-05-16 DIAGNOSIS — E119 Type 2 diabetes mellitus without complications: Secondary | ICD-10-CM | POA: Diagnosis not present

## 2022-05-16 DIAGNOSIS — Z23 Encounter for immunization: Secondary | ICD-10-CM

## 2022-05-16 DIAGNOSIS — Z2821 Immunization not carried out because of patient refusal: Secondary | ICD-10-CM

## 2022-05-16 DIAGNOSIS — M79641 Pain in right hand: Secondary | ICD-10-CM | POA: Diagnosis not present

## 2022-05-16 DIAGNOSIS — Z1329 Encounter for screening for other suspected endocrine disorder: Secondary | ICD-10-CM

## 2022-05-16 LAB — POCT UA - MICROALBUMIN
Albumin/Creatinine Ratio, Urine, POC: <30
Creatinine, POC: 300 mg/dL
Microalbumin Ur, POC: 30 mg/L

## 2022-05-16 MED ORDER — FEXOFENADINE HCL 60 MG PO TABS: 60.0000 mg | ORAL_TABLET | Freq: Every day | ORAL | 3 refills | Status: DC

## 2022-05-16 MED ORDER — IBUPROFEN 800 MG PO TABS: 800.0000 mg | ORAL_TABLET | Freq: Three times a day (TID) | ORAL | 2 refills | Status: DC | PRN

## 2022-05-16 MED ORDER — MONTELUKAST SODIUM 10 MG PO TABS: 20.0000 mg | ORAL_TABLET | ORAL | 3 refills | Status: DC

## 2022-05-16 NOTE — Progress Notes (Signed)
Complete physical exam  Patient: Bonnie Myers   DOB: [birth year]   41 y.o. Female  MRN: [id number]  Subjective:    Chief Complaint  Patient presents with   Annual Exam    Bonnie Myers is a 41 y.o. female who presents today for a complete physical exam. She reports consuming a general diet.  Has started doing yoga and is working on increasing her activity gradually.   She generally feels fairly well. She reports sleeping fairly well. She does not have additional problems to discuss today.   Most recent fall risk assessment:    06/04/2021   10:15 AM  Fall Risk   Falls in the past year? 0  Number falls in past yr: 0  Injury with Fall? 0  Risk for fall due to : No Fall Risks  Follow up Falls evaluation completed    Most recent depression screenings:    07/03/2021   11:43 AM 06/04/2021   10:14 AM  PHQ 2/9 Scores  PHQ - 2 Score 4 4  PHQ- 9 Score 10 16    Vision:Not within last year , Dental: No current dental problems and Receives regular dental care, and STD: The patient denies history of sexually transmitted disease.    Patient Care Team: Samuel Bouche, NP as PCP - General (Nurse Practitioner)   Outpatient Medications Prior to Visit  Medication Sig   aspirin 81 MG EC tablet Take by mouth.   ELDERBERRY PO Take by mouth daily.   metFORMIN (GLUCOPHAGE XR) 500 MG 24 hr tablet Take 1 tablet (500 mg total) by mouth daily with breakfast.   Probiotic Product (PROBIOTIC PO) Take by mouth daily.   [DISCONTINUED] fexofenadine (ALLEGRA) 60 MG tablet Take 60 mg by mouth 2 (two) times daily.   [DISCONTINUED] FIBER ADULT GUMMIES PO Take by mouth daily.   [DISCONTINUED] ibuprofen (ADVIL) 800 MG tablet Take 800 mg by mouth every 8 (eight) hours as needed.   [DISCONTINUED] montelukast (SINGULAIR) 10 MG tablet TAKE 1 TABLET (10 MG TOTAL) BY MOUTH EVERY MORNING. NEEDS APPT FOR FURTHER REFILLS   No facility-administered medications prior to visit.   Review of Systems  Constitutional:   Negative for chills, fever, malaise/fatigue and weight loss.  HENT:  Negative for congestion, ear pain, hearing loss, sinus pain and sore throat.   Eyes:  Negative for blurred vision, photophobia and pain.  Respiratory:  Negative for cough, shortness of breath and wheezing.   Cardiovascular:  Negative for chest pain, palpitations and leg swelling.  Gastrointestinal:  Positive for constipation. Negative for abdominal pain, blood in stool, diarrhea, heartburn, melena, nausea and vomiting.  Genitourinary:  Negative for dysuria, frequency and urgency.  Musculoskeletal:  Positive for joint pain (right hand). Negative for falls and neck pain.  Skin:  Negative for itching and rash.  Neurological:  Negative for dizziness, weakness and headaches.  Endo/Heme/Allergies:  Negative for polydipsia. Does not bruise/bleed easily.  Psychiatric/Behavioral:  Negative for depression, substance abuse and suicidal ideas. The patient is not nervous/anxious and does not have insomnia.      Objective:    BP 112/75 (BP Location: Left Arm, Cuff Size: Large)   Pulse 86   Resp 20   Ht '5\' 6"'$  (1.676 m)   Wt 226 lb 6.4 oz (102.7 kg)   SpO2 98%   BMI 36.54 kg/m    Physical Exam Constitutional:      General: She is not in acute distress.    Appearance: Normal appearance. She is obese. She  is not ill-appearing.  HENT:     Head: Normocephalic and atraumatic.     Right Ear: Tympanic membrane, ear canal and external ear normal. There is no impacted cerumen.     Left Ear: Tympanic membrane and external ear normal. There is no impacted cerumen.     Nose: Nose normal.     Mouth/Throat:     Mouth: Mucous membranes are moist.     Pharynx: No oropharyngeal exudate or posterior oropharyngeal erythema.  Eyes:     General: No scleral icterus.       Right eye: No discharge.        Left eye: No discharge.     Extraocular Movements: Extraocular movements intact.     Conjunctiva/sclera: Conjunctivae normal.     Pupils:  Pupils are equal, round, and reactive to light.  Neck:     Thyroid: No thyromegaly.     Vascular: No carotid bruit or JVD.     Trachea: Trachea normal.  Cardiovascular:     Rate and Rhythm: Normal rate and regular rhythm.     Pulses: Normal pulses.     Heart sounds: Normal heart sounds. No murmur heard.    No friction rub. No gallop.  Pulmonary:     Effort: Pulmonary effort is normal. No respiratory distress.     Breath sounds: Normal breath sounds. No wheezing.  Abdominal:     General: Bowel sounds are normal. There is no distension.     Palpations: Abdomen is soft.     Tenderness: There is no abdominal tenderness. There is no guarding.  Musculoskeletal:        General: Normal range of motion.     Cervical back: Normal range of motion and neck supple.  Lymphadenopathy:     Cervical: No cervical adenopathy.  Skin:    General: Skin is warm and dry.  Neurological:     Mental Status: She is alert and oriented to person, place, and time.     Cranial Nerves: No cranial nerve deficit.  Psychiatric:        Mood and Affect: Mood normal.        Behavior: Behavior normal.        Thought Content: Thought content normal.        Judgment: Judgment normal.    Results for orders placed or performed in visit on 05/16/22  POCT UA - Microalbumin  Result Value Ref Range   Microalbumin Ur, POC 30 mg/L   Creatinine, POC 300 mg/dL   Albumin/Creatinine Ratio, Urine, POC <30        Assessment & Plan:    Routine Health Maintenance and Physical Exam  Immunization History  Administered Date(s) Administered   PFIZER Comirnaty(Gray Top)Covid-19 Tri-Sucrose Vaccine 10/15/2020, 11/07/2020    Health Maintenance  Topic Date Due   HIV Screening  Never done   Hepatitis C Screening  Never done   INFLUENZA VACCINE  Never done   COVID-19 Vaccine (3 - Pfizer series) 05/31/2022 (Originally 01/02/2021)   TETANUS/TDAP  07/03/2022 (Originally 10/18/1998)   PAP SMEAR-Modifier  04/25/2024   HPV VACCINES   Aged Out    Discussed health benefits of physical activity, and encouraged her to engage in regular exercise appropriate for her age and condition.  1. Annual physical exam Checking labs as below.  Up-to-date on dental care however would recommend updating eye exam.  Wellness information provided with AVS. - Lipid panel - COMPLETE METABOLIC PANEL WITH GFR - CBC with Differential/Platelet  2. Influenza  vaccination declined Declined flu shot today.  Patient aware of risk versus benefits of vaccination.  3. Right hand pain She is being followed by orthopedic surgery for this however she is very concerned that there may be some early rheumatoid arthritis given her family history.  Adding on an ANA/RA panel. - ANA,IFA RA Diag Pnl w/rflx Tit/Patn  4. Controlled type 2 diabetes mellitus without complication, without long-term current use of insulin (HCC) Hemoglobin A1c at 6.6 at her last visit indicating progression to type 2 diabetes.  Checking hemoglobin A1c again today.  POCT microalbumin normal.  Referring to diabetic education.  Recommend resuming metformin as prescribed.  If unable to tolerate this due to GI side effects, she should reach out let me know soon as possible. - Hemoglobin A1c - POCT UA - Microalbumin - Ambulatory referral to diabetic education  5. Thyroid disorder screen Checking TSH. - TSH   Return in about 6 months (around 11/14/2022) for DM follow up.   Samuel Bouche, NP

## 2022-05-22 ENCOUNTER — Encounter: Payer: Self-pay | Admitting: Medical-Surgical

## 2022-05-22 DIAGNOSIS — E119 Type 2 diabetes mellitus without complications: Secondary | ICD-10-CM | POA: Insufficient documentation

## 2022-05-22 LAB — CBC WITH DIFFERENTIAL/PLATELET
Absolute Monocytes: 549 cells/uL (ref 200–950)
Basophils Absolute: 19 cells/uL (ref 0–200)
Basophils Relative: 0.2 %
Eosinophils Absolute: 307 cells/uL (ref 15–500)
Eosinophils Relative: 3.3 %
HCT: 35.6 % (ref 35.0–45.0)
Hemoglobin: 10.6 g/dL — ABNORMAL LOW (ref 11.7–15.5)
Lymphs Abs: 3050 cells/uL (ref 850–3900)
MCH: 21 pg — ABNORMAL LOW (ref 27.0–33.0)
MCHC: 29.8 g/dL — ABNORMAL LOW (ref 32.0–36.0)
MCV: 70.5 fL — ABNORMAL LOW (ref 80.0–100.0)
MPV: 9.5 fL (ref 7.5–12.5)
Monocytes Relative: 5.9 %
Neutro Abs: 5375 cells/uL (ref 1500–7800)
Neutrophils Relative %: 57.8 %
Platelets: 391 10*3/uL (ref 140–400)
RBC: 5.05 10*6/uL (ref 3.80–5.10)
RDW: 18.1 % — ABNORMAL HIGH (ref 11.0–15.0)
Total Lymphocyte: 32.8 %
WBC: 9.3 10*3/uL (ref 3.8–10.8)

## 2022-05-22 LAB — COMPLETE METABOLIC PANEL WITH GFR
AG Ratio: 1.2 (calc) (ref 1.0–2.5)
ALT: 11 U/L (ref 6–29)
AST: 14 U/L (ref 10–30)
Albumin: 3.9 g/dL (ref 3.6–5.1)
Alkaline phosphatase (APISO): 118 U/L (ref 31–125)
BUN: 11 mg/dL (ref 7–25)
CO2: 27 mmol/L (ref 20–32)
Calcium: 8.9 mg/dL (ref 8.6–10.2)
Chloride: 105 mmol/L (ref 98–110)
Creat: 0.87 mg/dL (ref 0.50–0.99)
Globulin: 3.2 g/dL (calc) (ref 1.9–3.7)
Glucose, Bld: 107 mg/dL — ABNORMAL HIGH (ref 65–99)
Potassium: 4.2 mmol/L (ref 3.5–5.3)
Sodium: 138 mmol/L (ref 135–146)
Total Bilirubin: 0.3 mg/dL (ref 0.2–1.2)
Total Protein: 7.1 g/dL (ref 6.1–8.1)
eGFR: 85 mL/min/{1.73_m2} (ref >=60)

## 2022-05-22 LAB — ANA,IFA RA DIAG PNL W/RFLX TIT/PATN
Anti Nuclear Antibody (ANA): NEGATIVE
Cyclic Citrullin Peptide Ab: <16 U
Rheumatoid fact SerPl-aCnc: <14 [IU]/mL

## 2022-05-22 LAB — LIPID PANEL
Cholesterol: 201 mg/dL — ABNORMAL HIGH (ref <200)
HDL: 46 mg/dL — ABNORMAL LOW (ref >=50)
LDL Cholesterol (Calc): 129 mg/dL (calc) — ABNORMAL HIGH
Non-HDL Cholesterol (Calc): 155 mg/dL (calc) — ABNORMAL HIGH (ref <130)
Total CHOL/HDL Ratio: 4.4 (calc) (ref <5.0)
Triglycerides: 151 mg/dL — ABNORMAL HIGH (ref <150)

## 2022-05-22 LAB — HEMOGLOBIN A1C
Hgb A1c MFr Bld: 6.5 %{Hb} — ABNORMAL HIGH
Mean Plasma Glucose: 140 mg/dL
eAG (mmol/L): 7.7 mmol/L

## 2022-05-22 LAB — TSH: TSH: 2.44 m[IU]/L

## 2022-06-04 ENCOUNTER — Other Ambulatory Visit: Payer: Self-pay | Admitting: Orthopedic Surgery

## 2022-06-04 DIAGNOSIS — M19041 Primary osteoarthritis, right hand: Secondary | ICD-10-CM

## 2022-07-04 ENCOUNTER — Encounter: Payer: No Typology Code available for payment source | Admitting: Dietician

## 2022-07-10 ENCOUNTER — Other Ambulatory Visit: Payer: Self-pay | Admitting: Medical-Surgical

## 2022-07-16 ENCOUNTER — Ambulatory Visit: Payer: No Typology Code available for payment source | Admitting: Dietician

## 2022-07-18 DIAGNOSIS — Z419 Encounter for procedure for purposes other than remedying health state, unspecified: Secondary | ICD-10-CM | POA: Diagnosis not present

## 2022-08-01 ENCOUNTER — Telehealth: Payer: Self-pay

## 2022-08-01 ENCOUNTER — Other Ambulatory Visit: Payer: Self-pay | Admitting: Medical-Surgical

## 2022-08-01 MED ORDER — ALBUTEROL SULFATE HFA 108 (90 BASE) MCG/ACT IN AERS: 2.0000 | INHALATION_SPRAY | Freq: Four times a day (QID) | RESPIRATORY_TRACT | 11 refills | Status: DC | PRN

## 2022-08-01 NOTE — Telephone Encounter (Signed)
Inhaler sent. We do not have a record of asthma on her chart.

## 2022-08-03 ENCOUNTER — Ambulatory Visit (INDEPENDENT_AMBULATORY_CARE_PROVIDER_SITE_OTHER): Payer: No Typology Code available for payment source

## 2022-08-03 ENCOUNTER — Ambulatory Visit (HOSPITAL_COMMUNITY)
Admission: EM | Admit: 2022-08-03 | Discharge: 2022-08-03 | Disposition: A | Discharge status: Home / Self Care | Payer: No Typology Code available for payment source | Attending: Physician Assistant | Admitting: Physician Assistant

## 2022-08-03 ENCOUNTER — Encounter (HOSPITAL_COMMUNITY): Payer: Self-pay | Admitting: *Deleted

## 2022-08-03 DIAGNOSIS — Z1152 Encounter for screening for COVID-19: Secondary | ICD-10-CM | POA: Insufficient documentation

## 2022-08-03 DIAGNOSIS — J069 Acute upper respiratory infection, unspecified: Secondary | ICD-10-CM | POA: Diagnosis not present

## 2022-08-03 DIAGNOSIS — J101 Influenza due to other identified influenza virus with other respiratory manifestations: Secondary | ICD-10-CM | POA: Insufficient documentation

## 2022-08-03 DIAGNOSIS — Z7952 Long term (current) use of systemic steroids: Secondary | ICD-10-CM | POA: Insufficient documentation

## 2022-08-03 DIAGNOSIS — J4521 Mild intermittent asthma with (acute) exacerbation: Secondary | ICD-10-CM | POA: Insufficient documentation

## 2022-08-03 DIAGNOSIS — R059 Cough, unspecified: Secondary | ICD-10-CM

## 2022-08-03 DIAGNOSIS — Z79899 Other long term (current) drug therapy: Secondary | ICD-10-CM | POA: Diagnosis not present

## 2022-08-03 LAB — RESP PANEL BY RT-PCR (FLU A&B, COVID) ARPGX2
Influenza A by PCR: NEGATIVE
Influenza B by PCR: POSITIVE — AB
SARS Coronavirus 2 by RT PCR: NEGATIVE

## 2022-08-03 MED ORDER — ALBUTEROL SULFATE HFA 108 (90 BASE) MCG/ACT IN AERS: 1.0000 | INHALATION_SPRAY | Freq: Four times a day (QID) | RESPIRATORY_TRACT | 0 refills | Status: DC | PRN

## 2022-08-03 MED ORDER — PREDNISONE 20 MG PO TABS: 40.0000 mg | ORAL_TABLET | Freq: Every day | ORAL | 0 refills | Status: AC

## 2022-08-03 MED ORDER — PROMETHAZINE-DM 6.25-15 MG/5ML PO SYRP: 5.0000 mL | ORAL_SOLUTION | Freq: Four times a day (QID) | ORAL | 0 refills | Status: DC | PRN

## 2022-08-03 NOTE — ED Triage Notes (Signed)
Pt states she started with fever 6 days ago but none in 2 days. She complains of cough, congestion, SOB with activity. She is asthmatic but doesn't have a albuterol MDI. Looks like she had a rx sent but she said pharmacy said the pharmacy needs more info and she is unsure what that is.    She said she is now shaky and that new the last 2 days.

## 2022-08-03 NOTE — Discharge Instructions (Addendum)
Chest x-ray normal. Take prednisone as prescribed Can use inhaler as needed for shortness of breath or wheezing Use cough syrup as needed for cough  Recommend Mucinex and flonase Return if you develop new or worsening symptoms.

## 2022-08-03 NOTE — ED Provider Notes (Signed)
[facility]    CSN: [id number] Arrival date & time: 08/03/22  1337      History   Chief Complaint Chief Complaint  Patient presents with   Sore Throat   Cough   Nasal Congestion   Shortness of Breath    HPI Bonnie Myers is a 41 y.o. female.   Patient complains of cough and congestion for the last 6 days.  She reports she has a history of asthma and out of her inhaler.  She reports she had a prescription for an inhaler called in by PCP with pharmacy has been unable to fill.  She reports intermittent shortness of breath.  She complains of worsening cough at bedtime when lying flat.      Past Medical History:  Diagnosis Date   Allergy    Anemia    Anxiety    Asthma    Bronchitis    Depression    Headache    HSV infection    Neuromuscular disorder ([place])    right - carpal tunnel   Pulmonary embolus ([place])    [place] - at 23 yrs ago, no problems since   Seasonal allergies    SVD (spontaneous vaginal delivery)    x 2    Patient Active Problem List   Diagnosis Date Noted   Type 2 diabetes mellitus without complications ([place]) 32/35/5732   Migraine without aura and without status migrainosus, not intractable 01/29/2021   Pulsatile tinnitus of both ears 01/29/2021   Herpes simplex 08/31/2018   Obesity 08/31/2018   Premenstrual tension syndrome 08/31/2018   Rectal hemorrhage 08/31/2018   Right upper quadrant pain 08/31/2018   Abnormal auditory perception of right ear 05/03/2018   Dizziness 05/03/2018   Epistaxis 05/03/2018   Jaw pain 05/03/2018   Anemia 04/01/2018   Menorrhagia with regular cycle 04/01/2018   Dysmenorrhea 04/01/2018   Pulmonary embolus (HCC)    Bilateral carpal tunnel syndrome 09/15/2017    Past Surgical History:  Procedure Laterality Date   CARPAL TUNNEL RELEASE Left 2019   DILATION AND CURETTAGE OF UTERUS     MAB   DILITATION & CURRETTAGE/HYSTROSCOPY WITH HYDROTHERMAL ABLATION N/A 05/06/2018   Procedure: DILATATION & CURETTAGE  WITH MINERVA  ABLATION;  Surgeon: Emily Filbert, MD;  Location: [facility];  Service: Gynecology;  Laterality: N/A;   FINGER SURGERY     right hand 4th/ring finger   FRACTURE SURGERY     Right ring finger   LAPAROSCOPIC OVARIAN CYSTECTOMY Right 05/06/2018   Procedure: LAPAROSCOPY DIAGNOSTIC WITH BIOPSY;  Surgeon: Emily Filbert, MD;  Location: [facility];  Service: Gynecology;  Laterality: Right;   THERAPEUTIC ABORTION     x 2 recvd anesthesia    TUBAL LIGATION     2006   WISDOM TOOTH EXTRACTION      OB History     Gravida  2   Para  2   Term      Preterm      AB      Living  2      SAB      IAB      Ectopic      Multiple      Live Births  2            Home Medications    Prior to Admission medications   Medication Sig Start Date End Date Taking? Authorizing Provider  albuterol (VENTOLIN HFA) 108 (90 Base) MCG/ACT inhaler Inhale 1-2 puffs into the lungs every  6 (six) hours as needed for wheezing or shortness of breath. 08/03/22  Yes Ward, Lenise Arena, PA-C  aspirin 81 MG EC tablet Take by mouth.   Yes [provider]  ELDERBERRY PO Take by mouth daily.   Yes [provider]  ibuprofen (ADVIL) 800 MG tablet Take 1 tablet (800 mg total) by mouth every 8 (eight) hours as needed. 05/16/22  Yes Jessup, Caryl Asp, NP  metFORMIN (GLUCOPHAGE-XR) 500 MG 24 hr tablet TAKE 1 TABLET BY MOUTH EVERY DAY WITH BREAKFAST 07/14/22  Yes Samuel Bouche, NP  montelukast (SINGULAIR) 10 MG tablet Take 2 tablets (20 mg total) by mouth every morning. 05/16/22  Yes Jessup, Joy, NP  predniSONE (DELTASONE) 20 MG tablet Take 2 tablets (40 mg total) by mouth daily for 5 days. 08/03/22 08/08/22 Yes Ward, Lenise Arena, PA-C  Probiotic Product (PROBIOTIC PO) Take by mouth daily.   Yes [provider]  promethazine-dextromethorphan (PROMETHAZINE-DM) 6.25-15 MG/5ML syrup Take 5 mLs by mouth 4 (four) times daily as needed for cough. 08/03/22  Yes Ward, Lenise Arena, PA-C  fexofenadine (ALLEGRA)  60 MG tablet Take 1 tablet (60 mg total) by mouth daily. 05/16/22   Samuel Bouche, NP    Family History Family History  Problem Relation Age of Onset   Colon cancer Paternal Grandmother    Diabetes Paternal Grandmother    Hypertension Father    Diabetes Father    Stroke Father    Endometrial cancer Mother    Diabetes Mother    Hypertension Mother    Cancer Mother    Diabetes Maternal Grandmother    Diabetes Paternal Grandfather    Vision loss Paternal Grandfather    Asthma Sister    Diabetes Sister    Miscarriages / Stillbirths Sister    Diabetes Maternal Uncle    Diabetes Maternal Uncle    Stroke Maternal Uncle     Social History Social History   Tobacco Use   Smoking status: Never   Smokeless tobacco: Never  Vaping Use   Vaping Use: Never used  Substance Use Topics   Alcohol use: Yes    Alcohol/week: 3.0 standard drinks of alcohol    Types: 3 Standard drinks or equivalent per week    Comment: wine/liquor    Drug use: Not Currently    Types: Marijuana    Comment: Hs marijuana - last use in early 20s     Allergies   Penicillins, Azithromycin, Bee venom, Clindamycin/lincomycin, Coumadin [warfarin], Diflucan [fluconazole], and Naproxen   Review of Systems Review of Systems  Constitutional:  Negative for chills and fever.  HENT:  Negative for ear pain and sore throat.   Eyes:  Negative for pain and visual disturbance.  Respiratory:  Positive for cough and shortness of breath.   Cardiovascular:  Negative for chest pain and palpitations.  Gastrointestinal:  Negative for abdominal pain and vomiting.  Genitourinary:  Negative for dysuria and hematuria.  Musculoskeletal:  Negative for arthralgias and back pain.  Skin:  Negative for color change and rash.  Neurological:  Negative for seizures and syncope.  All other systems reviewed and are negative.    Physical Exam Triage Vital Signs ED Triage Vitals  Enc Vitals Group     BP 08/03/22 1642 104/69     Pulse  Rate 08/03/22 1642 77     Resp 08/03/22 1642 18     Temp 08/03/22 1642 98.1 F (36.7 C)     Temp Source 08/03/22 1642 Oral     SpO2  08/03/22 1642 96 %     Weight --      Height --      Head Circumference --      Peak Flow --      Pain Score 08/03/22 1638 8     Pain Loc --      Pain Edu? --      Excl. in [place]? --    No data found.  Updated Vital Signs BP 104/69 (BP Location: Right Arm)   Pulse 77   Temp 98.1 F (36.7 C) (Oral)   Resp 18   LMP 07/28/2022 (Exact Date)   SpO2 96%   Visual Acuity Right Eye Distance:   Left Eye Distance:   Bilateral Distance:    Right Eye Near:   Left Eye Near:    Bilateral Near:     Physical Exam Vitals and nursing note reviewed.  Constitutional:      General: She is not in acute distress.    Appearance: She is well-developed.  HENT:     Head: Normocephalic and atraumatic.  Eyes:     Conjunctiva/sclera: Conjunctivae normal.  Cardiovascular:     Rate and Rhythm: Normal rate and regular rhythm.     Heart sounds: No murmur heard. Pulmonary:     Effort: Pulmonary effort is normal. No respiratory distress.     Breath sounds: Normal breath sounds.  Abdominal:     Palpations: Abdomen is soft.     Tenderness: There is no abdominal tenderness.  Musculoskeletal:        General: No swelling.     Cervical back: Neck supple.  Skin:    General: Skin is warm and dry.     Capillary Refill: Capillary refill takes less than 2 seconds.  Neurological:     Mental Status: She is alert.  Psychiatric:        Mood and Affect: Mood normal.      UC Treatments / Results  Labs (all labs ordered are listed, but only abnormal results are displayed) Labs Reviewed  RESP PANEL BY RT-PCR (FLU A&B, COVID) [id number]    EKG   Radiology No results found.  Procedures Procedures (including critical care time)  Medications Ordered in UC Medications - No data to display  Initial Impression / Assessment and Plan / UC Course  I have reviewed the  triage vital signs and the nursing notes.  Pertinent labs & imaging results that were available during my care of the patient were reviewed by me and considered in my medical decision making (see chart for details).     URI, COVID/flu pending.  Patient experiencing asthma exacerbation.  Will prescribe course of prednisone, refill albuterol inhaler, and prescribed cough syrup.  Patient stable, in no acute distress, vitals within normal limits, clear to auscultation.  X-ray ordered per patient request, negative. Final Clinical Impressions(s) / UC Diagnoses   Final diagnoses:  Viral upper respiratory tract infection  Mild intermittent asthma with acute exacerbation     Discharge Instructions      Take prednisone as prescribed Can use inhaler as needed for shortness of breath or wheezing Use cough syrup as needed for cough  Recommend Mucinex and flonase Return if you develop new or worsening symptoms.      ED Prescriptions     Medication Sig Dispense Auth. Provider   albuterol (VENTOLIN HFA) 108 (90 Base) MCG/ACT inhaler Inhale 1-2 puffs into the lungs every 6 (six) hours as needed for wheezing or shortness of breath. 1  each Ward, Lenise Arena, PA-C   predniSONE (DELTASONE) 20 MG tablet Take 2 tablets (40 mg total) by mouth daily for 5 days. 10 tablet Ward, Lenise Arena, PA-C   promethazine-dextromethorphan (PROMETHAZINE-DM) 6.25-15 MG/5ML syrup Take 5 mLs by mouth 4 (four) times daily as needed for cough. 118 mL Ward, Lenise Arena, PA-C      PDMP not reviewed this encounter.   Ward, Lenise Arena, PA-C 08/03/22 1724

## 2022-08-04 NOTE — Telephone Encounter (Signed)
Attempted call to let patient know that script had been sent to pharmacy. Left voice mail message to return our call.

## 2022-08-18 DIAGNOSIS — Z419 Encounter for procedure for purposes other than remedying health state, unspecified: Secondary | ICD-10-CM | POA: Diagnosis not present

## 2022-08-27 ENCOUNTER — Ambulatory Visit: Payer: No Typology Code available for payment source | Admitting: Dietician

## 2022-09-18 DIAGNOSIS — Z419 Encounter for procedure for purposes other than remedying health state, unspecified: Secondary | ICD-10-CM | POA: Diagnosis not present

## 2022-10-17 DIAGNOSIS — Z419 Encounter for procedure for purposes other than remedying health state, unspecified: Secondary | ICD-10-CM | POA: Diagnosis not present

## 2022-10-24 ENCOUNTER — Other Ambulatory Visit: Payer: Self-pay | Admitting: Medical-Surgical

## 2022-10-24 MED ORDER — ACCU-CHEK SOFTCLIX LANCETS MISC: 12 refills | Status: DC

## 2022-10-24 MED ORDER — ACCU-CHEK GUIDE VI STRP: ORAL_STRIP | 12 refills | Status: DC

## 2022-11-17 DIAGNOSIS — Z419 Encounter for procedure for purposes other than remedying health state, unspecified: Secondary | ICD-10-CM | POA: Diagnosis not present

## 2022-12-17 DIAGNOSIS — Z419 Encounter for procedure for purposes other than remedying health state, unspecified: Secondary | ICD-10-CM | POA: Diagnosis not present

## 2023-01-17 DIAGNOSIS — Z419 Encounter for procedure for purposes other than remedying health state, unspecified: Secondary | ICD-10-CM | POA: Diagnosis not present

## 2023-02-16 DIAGNOSIS — Z419 Encounter for procedure for purposes other than remedying health state, unspecified: Secondary | ICD-10-CM | POA: Diagnosis not present

## 2023-03-19 DIAGNOSIS — Z419 Encounter for procedure for purposes other than remedying health state, unspecified: Secondary | ICD-10-CM | POA: Diagnosis not present

## 2023-04-19 DIAGNOSIS — Z419 Encounter for procedure for purposes other than remedying health state, unspecified: Secondary | ICD-10-CM | POA: Diagnosis not present

## 2023-05-19 DIAGNOSIS — Z419 Encounter for procedure for purposes other than remedying health state, unspecified: Secondary | ICD-10-CM | POA: Diagnosis not present

## 2023-05-31 ENCOUNTER — Other Ambulatory Visit: Payer: Self-pay | Admitting: Medical-Surgical

## 2023-06-04 ENCOUNTER — Telehealth: Payer: Self-pay | Admitting: Medical-Surgical

## 2023-06-04 NOTE — Telephone Encounter (Signed)
Not on current medication list  Last office visit 05/16/2022

## 2023-06-04 NOTE — Telephone Encounter (Signed)
Prescription Request  06/04/2023  LOV: Visit date not found  What is the name of the medication or equipment? ibuprofen (ADVIL) 800 MG tablet [[id number]]   Have you contacted your pharmacy to request a refill? Yes   Which pharmacy would you like this sent to?  [id number] [place], [place] - [address] [place] [address] Phone: [phone] Fax: [phone]  [id number] - [place], [place] - [address] AT [facility] [address] [place] [address] Phone: [phone] Fax: [phone]    Patient notified that their request is being sent to the clinical staff for review and that they should receive a response within 2 business days.   Please advise at Mobile [phone] (mobile)

## 2023-06-05 NOTE — Telephone Encounter (Signed)
Please contact the patient for an appointment.   Last seen 05/16/2022  Last filled 05/16/2022

## 2023-06-19 DIAGNOSIS — Z419 Encounter for procedure for purposes other than remedying health state, unspecified: Secondary | ICD-10-CM | POA: Diagnosis not present

## 2023-07-19 DIAGNOSIS — Z419 Encounter for procedure for purposes other than remedying health state, unspecified: Secondary | ICD-10-CM | POA: Diagnosis not present

## 2023-07-23 ENCOUNTER — Other Ambulatory Visit: Payer: Self-pay | Admitting: Medical-Surgical

## 2023-07-23 DIAGNOSIS — R921 Mammographic calcification found on diagnostic imaging of breast: Secondary | ICD-10-CM

## 2023-08-13 ENCOUNTER — Encounter: Payer: Self-pay | Admitting: Medical-Surgical

## 2023-08-13 ENCOUNTER — Ambulatory Visit (INDEPENDENT_AMBULATORY_CARE_PROVIDER_SITE_OTHER): Payer: No Typology Code available for payment source | Admitting: Medical-Surgical

## 2023-08-13 VITALS — BP 133/78 | HR 88 | Resp 20 | Ht 66.0 in | Wt 226.4 lb

## 2023-08-13 DIAGNOSIS — D649 Anemia, unspecified: Secondary | ICD-10-CM | POA: Diagnosis not present

## 2023-08-13 DIAGNOSIS — E569 Vitamin deficiency, unspecified: Secondary | ICD-10-CM

## 2023-08-13 DIAGNOSIS — E119 Type 2 diabetes mellitus without complications: Secondary | ICD-10-CM | POA: Diagnosis not present

## 2023-08-13 DIAGNOSIS — Z7984 Long term (current) use of oral hypoglycemic drugs: Secondary | ICD-10-CM

## 2023-08-13 LAB — POCT UA - MICROALBUMIN
Creatinine, POC: 300 mg/dL
Microalbumin Ur, POC: 80 mg/L

## 2023-08-13 LAB — POCT GLYCOSYLATED HEMOGLOBIN (HGB A1C)
HbA1c, POC (controlled diabetic range): 6.9 % (ref 0.0–7.0)
Hemoglobin A1C: 6.9 % — AB (ref 4.0–5.6)

## 2023-08-13 MED ORDER — ACCU-CHEK GUIDE TEST VI STRP: ORAL_STRIP | 12 refills | Status: AC

## 2023-08-13 MED ORDER — METFORMIN HCL ER 500 MG PO TB24: 500.0000 mg | ORAL_TABLET | Freq: Two times a day (BID) | ORAL | 1 refills | Status: DC

## 2023-08-13 MED ORDER — FEXOFENADINE HCL 60 MG PO TABS: 60.0000 mg | ORAL_TABLET | Freq: Every day | ORAL | 3 refills | Status: AC

## 2023-08-13 MED ORDER — ACCU-CHEK SOFTCLIX LANCETS MISC: 12 refills | Status: AC

## 2023-08-13 MED ORDER — IBUPROFEN 800 MG PO TABS: 800.0000 mg | ORAL_TABLET | Freq: Three times a day (TID) | ORAL | 2 refills | Status: AC | PRN

## 2023-08-13 MED ORDER — MONTELUKAST SODIUM 10 MG PO TABS: 20.0000 mg | ORAL_TABLET | Freq: Every morning | ORAL | 0 refills | Status: DC

## 2023-08-13 NOTE — Progress Notes (Signed)
        Established patient visit  History, exam, impression, and plan:  1. Type 2 diabetes mellitus without complication, without long-term current use of insulin (HCC) (Primary) Bonnie Myers 42 year old female presenting today with a history of type 2 diabetes, overdue for follow-up.  Occasionally checking sugars but not on a regular basis.  Compliant with metformin XR 500 mg daily.  Admits to room for improvement in dietary habits and need to stay away from carbs.  Last hemoglobin A1c checked in September 2023 at 6.5%.  Today, recheck was 6.9%.  Plan to increase metformin to 500 mg twice daily with meals.  POCT microalbumin abnormal.  Recommend working on tighter glucose control and plan to recheck in 6 months on reevaluation.  Foot exam completed today with no areas of neuropathy noted.  Checking labs as noted below. - POCT UA - Microalbumin - CMP14+EGFR - Lipid panel - HM Diabetes Foot Exam - POCT HgB A1C  2. Anemia, unspecified type History of anemia.  Rechecking labs today. - CBC - Iron, TIBC and Ferritin Panel - Vitamin B12  3. Vitamin deficiency Continues to worry about vitamin deficiency given her history of anemia.  Plan to check labs as noted below to better guide recommendations for supplementation. - Iron, TIBC and Ferritin Panel - VITAMIN D 25 Hydroxy (Vit-D Deficiency, Fractures) - Vitamin B12  Procedures performed this visit: None.  Return in about 6 months (around 02/11/2024) for DM/HTN/HLD follow up.  __________________________________ Thayer Ohm, DNP, APRN, FNP-BC Primary Care and Sports Medicine [facility] [place]

## 2023-08-14 ENCOUNTER — Encounter: Payer: Self-pay | Admitting: Medical-Surgical

## 2023-08-14 DIAGNOSIS — E611 Iron deficiency: Secondary | ICD-10-CM

## 2023-08-14 LAB — CMP14+EGFR
ALT: 13 [IU]/L (ref 0–32)
AST: 15 [IU]/L (ref 0–40)
Albumin: 4.1 g/dL (ref 3.9–4.9)
Alkaline Phosphatase: 169 [IU]/L — ABNORMAL HIGH (ref 44–121)
BUN/Creatinine Ratio: 11 (ref 9–23)
BUN: 10 mg/dL (ref 6–24)
Bilirubin Total: <0.2 mg/dL (ref 0.0–1.2)
CO2: 25 mmol/L (ref 20–29)
Calcium: 9.4 mg/dL (ref 8.7–10.2)
Chloride: 100 mmol/L (ref 96–106)
Creatinine, Ser: 0.87 mg/dL (ref 0.57–1.00)
Globulin, Total: 3.6 g/dL (ref 1.5–4.5)
Glucose: 102 mg/dL — ABNORMAL HIGH (ref 70–99)
Potassium: 5 mmol/L (ref 3.5–5.2)
Sodium: 137 mmol/L (ref 134–144)
Total Protein: 7.7 g/dL (ref 6.0–8.5)
eGFR: 85 mL/min/{1.73_m2} (ref >59)

## 2023-08-14 LAB — LIPID PANEL
Chol/HDL Ratio: 5 {ratio} — ABNORMAL HIGH (ref 0.0–4.4)
Cholesterol, Total: 212 mg/dL — ABNORMAL HIGH (ref 100–199)
HDL: 42 mg/dL (ref >39)
LDL Chol Calc (NIH): 131 mg/dL — ABNORMAL HIGH (ref 0–99)
Triglycerides: 220 mg/dL — ABNORMAL HIGH (ref 0–149)
VLDL Cholesterol Cal: 39 mg/dL (ref 5–40)

## 2023-08-14 LAB — IRON,TIBC AND FERRITIN PANEL
Ferritin: 16 ng/mL (ref 15–150)
Iron Saturation: 6 % — CL (ref 15–55)
Iron: 26 ug/dL — ABNORMAL LOW (ref 27–159)
Total Iron Binding Capacity: 433 ug/dL (ref 250–450)
UIBC: 407 ug/dL (ref 131–425)

## 2023-08-14 LAB — CBC
Hematocrit: 36.9 % (ref 34.0–46.6)
Hemoglobin: 10.6 g/dL — ABNORMAL LOW (ref 11.1–15.9)
MCH: 20.4 pg — ABNORMAL LOW (ref 26.6–33.0)
MCHC: 28.7 g/dL — ABNORMAL LOW (ref 31.5–35.7)
MCV: 71 fL — ABNORMAL LOW (ref 79–97)
Platelets: 464 10*3/uL — ABNORMAL HIGH (ref 150–450)
RBC: 5.2 x10E6/uL (ref 3.77–5.28)
RDW: 18.5 % — ABNORMAL HIGH (ref 11.7–15.4)
WBC: 11.5 10*3/uL — ABNORMAL HIGH (ref 3.4–10.8)

## 2023-08-14 LAB — VITAMIN B12: Vitamin B-12: 526 pg/mL (ref 232–1245)

## 2023-08-14 LAB — VITAMIN D 25 HYDROXY (VIT D DEFICIENCY, FRACTURES): Vit D, 25-Hydroxy: 22.9 ng/mL — ABNORMAL LOW (ref 30.0–100.0)

## 2023-08-19 DIAGNOSIS — Z419 Encounter for procedure for purposes other than remedying health state, unspecified: Secondary | ICD-10-CM | POA: Diagnosis not present

## 2023-08-24 ENCOUNTER — Ambulatory Visit
Admission: RE | Admit: 2023-08-24 | Discharge: 2023-08-24 | Disposition: A | Discharge status: Home / Self Care | Payer: No Typology Code available for payment source | Source: Ambulatory Visit | Attending: Medical-Surgical | Admitting: Medical-Surgical

## 2023-08-24 DIAGNOSIS — R921 Mammographic calcification found on diagnostic imaging of breast: Secondary | ICD-10-CM

## 2023-09-16 NOTE — Progress Notes (Addendum)
[facility]   [phone] [phone]   Clinic New consult Note   Patient Care Team: Christen Butter, NP as PCP - General (Nurse Practitioner) 09/17/2023  CHIEF COMPLAINTS/PURPOSE OF CONSULTATION:  Anemia    HISTORY OF PRESENTING ILLNESS:  Bonnie Myers 43 y.o. female is here because of iron deficiency anemia. Labs checked during routine visit with primary care on 08/13/2023, showed Hgb of 10.6, Hct 36.8, and platelet count 464. Reviewing historical labs, she has had low Hgb since, at least, 2019. With labs on 08/13/2023, iron panel was ordered. Her iron level was 26 with saturation ratio of 6. Ferritin was 16. B 12 level was normal at 526. Her primary provider recommended she be evaluated for iron replacement. She has not had colonoscopy. Last GYN visit/pap smear was September 2022. Pap results were negative. She has had a few d&c procedures in the past. She has also had endometrial ablation. The latter procedure did help with very heavy menstrual periods with clots.  In the past, she has been on depo provera. While on this, she still had very heavy menstrual periods. She ultimately developed a pulmonary embolism while on depo-provera. She has not been on any hormonal medications since then.  The patient reports that she has been anemic for as long as she can remember. She has been unable to tolerate oral iron in the past. They cause severe constipation. She has had multiple IV iron infusions in the past. She states that the iron infusions helped a great deal.  Socially, the patient lives alone. She is a single mother of two adult children. She does not smoke. Reports drinking alcohol on social basis.  She states that her mother did have endometrial cancer in the past. Her paternal grandfather had colon cancer. She states that her sister, previously on oral contraceptives, developed PE while on birth control.   She was found to have abnormal CBC from 06/13/2023.  Her hgb was 10.6 with very low iron saturation ratio, low iron, and low/normal ferritin level.  She has mild and intermittent chest pain on exertion and shortness of breath on minimal exertion. She denies pre-syncopal episodes, or palpitations. She had not noticed any recent bleeding such as epistaxis, hematuria or hematochezia. She states that her menstrual periods have improved, however, she still has heavy bleeding every month.  The patient denies over the counter NSAID ingestion. She is not  on antiplatelets agents. Her last colonoscopy was n/a due to age.  She had no prior history or diagnosis of cancer. Her age appropriate screening programs are up-to-date. She denies any pica and eats a variety of diet. She never donated blood or received blood transfusion. She has had IV iron therapy in the past.  The patient was prescribed oral iron supplements. She does not take them as they have caused her to have severe constipation in the past.    REVIEW OF SYSTEMS:   Constitutional: Denies fevers, chills or abnormal night sweats. She does suffer from moderate to severe fatigue.  Eyes: Denies blurriness of vision, double vision or watery eyes Ears, nose, mouth, throat, and face: Denies mucositis or sore throat Respiratory: Denies cough or wheezes. She states that she does get short of breath with minimal exertion.  Cardiovascular: Denies palpitation or lower extremity swelling. She does get intermittent chest discomfort.  Gastrointestinal:  Denies nausea, heartburn or change in bowel habits Skin: Denies abnormal skin rashes Lymphatics: Denies new lymphadenopathy or easy bruising Neurological:Denies numbness, tingling or new weaknesses  Behavioral/Psych: Mood is stable, no new changes   All other systems were reviewed with the patient and are negative.   MEDICAL HISTORY:  Past Medical History:  Diagnosis Date   Allergy    Anemia    Anxiety    Asthma    Bronchitis    Depression     Headache    HSV infection    Neuromuscular disorder (HCC)    right - carpal tunnel   Pulmonary embolus (HCC)    [place] - at 22 yrs ago, no problems since   Seasonal allergies    SVD (spontaneous vaginal delivery)    x 2    SURGICAL HISTORY: Past Surgical History:  Procedure Laterality Date   CARPAL TUNNEL RELEASE Left 2019   DILATION AND CURETTAGE OF UTERUS     MAB   DILITATION & CURRETTAGE/HYSTROSCOPY WITH HYDROTHERMAL ABLATION N/A 05/06/2018   Procedure: DILATATION & CURETTAGE WITH MINERVA  ABLATION;  Surgeon: Allie Bossier, MD;  Location: [facility];  Service: Gynecology;  Laterality: N/A;   FINGER SURGERY     right hand 4th/ring finger   FRACTURE SURGERY     Right ring finger   LAPAROSCOPIC OVARIAN CYSTECTOMY Right 05/06/2018   Procedure: LAPAROSCOPY DIAGNOSTIC WITH BIOPSY;  Surgeon: Allie Bossier, MD;  Location: [facility];  Service: Gynecology;  Laterality: Right;   THERAPEUTIC ABORTION     x 2 recvd anesthesia    TUBAL LIGATION     2006   WISDOM TOOTH EXTRACTION      SOCIAL HISTORY: Social History   Socioeconomic History   Marital status: Single    Spouse name: Not on file   Number of children: 2   Years of education: Not on file   Highest education level: Not on file  Occupational History   Not on file  Tobacco Use   Smoking status: Never   Smokeless tobacco: Never  Vaping Use   Vaping status: Never Used  Substance and Sexual Activity   Alcohol use: Yes    Alcohol/week: 3.0 standard drinks of alcohol    Types: 3 Standard drinks or equivalent per week    Comment: wine/liquor    Drug use: Not Currently    Types: Marijuana    Comment: Hs marijuana - last use in early 20s   Sexual activity: Not Currently    Birth control/protection: Abstinence, Surgical    Comment: tubal  Other Topics Concern   Not on file  Social History Narrative   Not on file   Social Drivers of Health   Financial Resource Strain: Not on file  Food Insecurity: Low Risk  (08/27/2023)    Received from [facility]   Hunger Vital Sign    Worried About Running Out of Food in the Last Year: Never true    Ran Out of Food in the Last Year: Never true  Transportation Needs: No Transportation Needs (08/27/2023)   Received from [facility]    In the past 12 months, has lack of reliable transportation kept you from medical appointments, meetings, work or from getting things needed for daily living? : No  Physical Activity: Not on file  Stress: Not on file  Social Connections: Not on file  Intimate Partner Violence: Not on file    FAMILY HISTORY: Family History  Problem Relation Age of Onset   Colon cancer Paternal Grandmother    Diabetes Paternal Grandmother    Hypertension Father    Diabetes Father    Stroke  Father    Endometrial cancer Mother    Diabetes Mother    Hypertension Mother    Cancer Mother    Diabetes Maternal Grandmother    Diabetes Paternal Grandfather    Vision loss Paternal Grandfather    Asthma Sister    Diabetes Sister    Miscarriages / Stillbirths Sister    Diabetes Maternal Uncle    Diabetes Maternal Uncle    Stroke Maternal Uncle     ALLERGIES:  is allergic to penicillins, azithromycin, bee venom, clindamycin/lincomycin, coumadin [warfarin], diflucan [fluconazole], and naproxen.  MEDICATIONS:  Current Outpatient Medications  Medication Sig Dispense Refill   Accu-Chek Softclix Lancets lancets Use as instructed 100 each 12   albuterol (VENTOLIN HFA) 108 (90 Base) MCG/ACT inhaler Inhale 1-2 puffs into the lungs every 6 (six) hours as needed for wheezing or shortness of breath. 1 each 0   aspirin 81 MG EC tablet Take by mouth.     ELDERBERRY PO Take by mouth daily.     fexofenadine (ALLEGRA) 60 MG tablet Take 1 tablet (60 mg total) by mouth daily. 90 tablet 3   glucose blood (ACCU-CHEK GUIDE TEST) test strip Use as instructed 100 each 12   ibuprofen (ADVIL) 800 MG tablet Take 1 tablet (800 mg total) by mouth every 8 (eight)  hours as needed. 90 tablet 2   metFORMIN (GLUCOPHAGE-XR) 500 MG 24 hr tablet Take 1 tablet (500 mg total) by mouth 2 (two) times daily with a meal. 180 tablet 1   montelukast (SINGULAIR) 10 MG tablet Take 2 tablets (20 mg total) by mouth every morning. 180 tablet 0   Probiotic Product (PROBIOTIC PO) Take by mouth daily.     promethazine-dextromethorphan (PROMETHAZINE-DM) 6.25-15 MG/5ML syrup Take 5 mLs by mouth 4 (four) times daily as needed for cough. 118 mL 0   No current facility-administered medications for this visit.    PHYSICAL EXAMINATION: ECOG PERFORMANCE STATUS: 1 - Symptomatic but completely ambulatory  Vitals:   09/17/23 1442 09/17/23 1445  BP: (!) 156/100 120/75  Pulse: 82 93  Resp: 18   Temp: 97.6 F (36.4 C)   SpO2: 100%    Filed Weights   09/17/23 1442  Weight: 228 lb 1.6 oz (103.5 kg)    GENERAL:alert, no distress and comfortable SKIN: skin color, texture, turgor are normal, no rashes or significant lesions EYES: normal, conjunctiva are pink and non-injected, sclera clear OROPHARYNX:no exudate, no erythema and lips, buccal mucosa, and tongue normal  NECK: supple, thyroid normal size, non-tender, without nodularity LYMPH:  no palpable lymphadenopathy in the cervical, axillary or inguinal LUNGS: clear to auscultation and percussion with normal breathing effort HEART: regular rate & rhythm and no murmurs and no lower extremity edema ABDOMEN:abdomen soft, non-tender and normal bowel sounds Musculoskeletal:no cyanosis of digits and no clubbing  PSYCH: alert & oriented x 3 with fluent speech NEURO: no focal motor/sensory deficits  LABORATORY DATA:  I have reviewed the data as listed    Latest Ref Rng & Units 08/13/2023    2:47 PM 05/16/2022   11:05 AM 06/04/2021   12:00 AM  CBC  WBC 3.4 - 10.8 x10E3/uL 11.5  9.3  9.6   Hemoglobin 11.1 - 15.9 g/dL 96.2  95.2  84.1   Hematocrit 34.0 - 46.6 % 36.9  35.6  36.6   Platelets 150 - 450 x10E3/uL 464  391  380         Latest Ref Rng & Units 08/13/2023    2:47 PM 05/16/2022  11:05 AM 06/04/2021   12:00 AM  CMP  Glucose 70 - 99 mg/dL 308  657  846   BUN 6 - 24 mg/dL 10  11  11    Creatinine 0.57 - 1.00 mg/dL 9.62  9.52  8.41   Sodium 134 - 144 mmol/L 137  138  139   Potassium 3.5 - 5.2 mmol/L 5.0  4.2  4.4   Chloride 96 - 106 mmol/L 100  105  104   CO2 20 - 29 mmol/L 25  27  28    Calcium 8.7 - 10.2 mg/dL 9.4  8.9  9.1   Total Protein 6.0 - 8.5 g/dL 7.7  7.1  7.1   Total Bilirubin 0.0 - 1.2 mg/dL <3.2  0.3  0.3   Alkaline Phos 44 - 121 IU/L 169     AST 0 - 40 IU/L 15  14  11    ALT 0 - 32 IU/L 13  11  9     Assessment and Plan Anemia, unspecified type  Iron deficiency anemia due to chronic blood loss Assessment & Plan: 08/13/2023 - Labs checked during routine visit with primary care showed Hgb of 10.6, Hct 36.8, and platelet count 464. Reviewing historical labs, she has had low Hgb since, at least, 2019. iron panel was ordered. Her iron level was 26 with saturation ratio of 6. Ferritin was 16. B 12 level was normal at 526. Her primary provider recommended she be evaluated for iron replacement. It does not appear that she has had colonoscopy. Last GYN visit/pap smear was September 2022. Pap results were negative. She has had several GYN procedures. Most recently, she had endometrial ablation. She continues to have menstrual periods, though heaviness has improved some.  -will schedule patient for IV iron infusions at [facility].  -will see her back in 8 weeks and recheck labs and need for additional IV iron treatments.  -consider referral to GI for colonoscopy/endoscopy in future to evaluate for microscopic bleeding.    Other orders -     Iron Sucrose -     Famotidine in NaCl -     methylPREDNISolone Sodium Succ -     diphenhydrAMINE HCl -     Albuterol Sulfate HFA -     EPINEPHrine    RADIOGRAPHIC STUDIES:MM 3D DIAGNOSTIC MAMMOGRAM BILATERAL BREAST Result Date:  08/24/2023 CLINICAL DATA:  Delayed follow-up for right breast calcifications, last assessed with diagnostic imaging on 12/03/2020. EXAM: DIGITAL DIAGNOSTIC BILATERAL MAMMOGRAM WITH TOMOSYNTHESIS AND CAD TECHNIQUE: Bilateral digital diagnostic mammography and breast tomosynthesis was performed. The images were evaluated with computer-aided detection. COMPARISON:  Previous exam(s). ACR Breast Density Category b: There are scattered areas of fibroglandular density. FINDINGS: The small group of calcifications in the lower outer right breast are stable. There are no breast masses, areas of significant asymmetry, areas of architectural distortion or new or suspicious calcifications. IMPRESSION: 1. No evidence of breast malignancy. 2. Benign right breast calcifications. RECOMMENDATION: Screening mammogram in one year.(Code:SM-B-01Y) I have discussed the findings and recommendations with the patient. If applicable, a reminder letter will be sent to the patient regarding the next appointment. BI-RADS CATEGORY  2: Benign. Electronically Signed   By: Amie Portland M.D.   On: 08/24/2023 13:28    The patient was seen along with Dr. Mosetta Putt. All questions were answered. The patient knows to call the clinic with any problems, questions or concerns. Time spent with the patient was approximately 30 minutes. This time included reviewing progress notes, labs,  imaging studies, and discussing plan for follow up.       Carlean Jews, NP 09/17/2023 5:00 PM  Addendum I have seen the patient, examined her. I agree with the assessment and and plan and have edited the notes.   43 year old lady with history of iron deficient anemia from menorrhagia, was referred for management of her anemia.  Her recent lab from August 13, 2023 was consistent with iron deficient anemia.  She does not tolerate oral iron, we will schedule IV iron for her.  Benefit and side effect discussed with her, including severe anaphylactic allergy reaction.   She previously tolerated IV iron.  She is willing to monitor her lab and iron level with [place].  Unless her anemia does not resolve with adequate IV iron, I do not think she needs additional anemia workup.  All questions were answered.  I spent a total of 30 minutes for her visit today, more than 50% time on face-to-face counseling.  Malachy Mood MD 09/17/2023

## 2023-09-16 NOTE — Assessment & Plan Note (Signed)
08/13/2023 - Labs checked during routine visit with primary care showed Hgb of 10.6, Hct 36.8, and platelet count 464. Reviewing historical labs, she has had low Hgb since, at least, 2019. iron panel was ordered. Her iron level was 26 with saturation ratio of 6. Ferritin was 16. B 12 level was normal at 526. Her primary provider recommended she be evaluated for iron replacement. It does not appear that she has had colonoscopy. Last GYN visit/pap smear was September 2022. Pap results were negative. She has had several GYN procedures. Most recently, she had endometrial ablation. She continues to have menstrual periods, though heaviness has improved some.  -will schedule patient for IV iron infusions at [facility]  -will see her back in 8 weeks and recheck labs and need for additional IV iron treatments.

## 2023-09-17 ENCOUNTER — Inpatient Hospital Stay: Payer: No Typology Code available for payment source | Attending: Nurse Practitioner | Admitting: Nurse Practitioner

## 2023-09-17 ENCOUNTER — Inpatient Hospital Stay: Payer: No Typology Code available for payment source

## 2023-09-17 VITALS — BP 120/75 | HR 93 | Temp 97.6°F | Resp 18 | Ht 67.0 in | Wt 228.1 lb

## 2023-09-17 DIAGNOSIS — N920 Excessive and frequent menstruation with regular cycle: Secondary | ICD-10-CM

## 2023-09-17 DIAGNOSIS — Z800 Family history of malignant neoplasm of digestive organs: Secondary | ICD-10-CM

## 2023-09-17 DIAGNOSIS — D500 Iron deficiency anemia secondary to blood loss (chronic): Secondary | ICD-10-CM

## 2023-09-17 DIAGNOSIS — D649 Anemia, unspecified: Secondary | ICD-10-CM

## 2023-09-17 DIAGNOSIS — D509 Iron deficiency anemia, unspecified: Secondary | ICD-10-CM | POA: Insufficient documentation

## 2023-09-17 NOTE — Assessment & Plan Note (Signed)
08/13/2023 - Labs checked during routine visit with primary care showed Hgb of 10.6, Hct 36.8, and platelet count 464. Reviewing historical labs, she has had low Hgb since, at least, 2019. iron panel was ordered. Her iron level was 26 with saturation ratio of 6. Ferritin was 16. B 12 level was normal at 526. Her primary provider recommended she be evaluated for iron replacement. It does not appear that she has had colonoscopy. Last GYN visit/pap smear was September 2022. Pap results were negative. She has had several GYN procedures. Most recently, she had endometrial ablation. She continues to have menstrual periods, though heaviness has improved some.  -will schedule patient for IV iron infusions at [facility].  -will see her back in 8 weeks and recheck labs and need for additional IV iron treatments.  -consider referral to GI for colonoscopy/endoscopy in future to evaluate for microscopic bleeding.

## 2023-09-18 ENCOUNTER — Encounter: Payer: Self-pay | Admitting: Nurse Practitioner

## 2023-09-19 ENCOUNTER — Telehealth: Payer: Self-pay | Admitting: Pharmacy Technician

## 2023-09-19 DIAGNOSIS — Z419 Encounter for procedure for purposes other than remedying health state, unspecified: Secondary | ICD-10-CM | POA: Diagnosis not present

## 2023-09-19 NOTE — Telephone Encounter (Signed)
Vista Lawman note:  Patient will be scheduled as soon as possible.  Auth Submission: NO AUTH NEEDED Site of care: Site of care: [facility] Payer: [employer] Medication & CPT/J Code(s) submitted: Venofer (Iron Sucrose) J1756 Route of submission (phone, fax, portal):  Phone # Fax # Auth type: Buy/Bill PB Units/visits requested: 5 DOSES Reference number:  Approval from: 09/19/23 to 02/16/24

## 2023-10-01 ENCOUNTER — Encounter: Payer: Self-pay | Admitting: Medical-Surgical

## 2023-10-09 ENCOUNTER — Ambulatory Visit: Payer: No Typology Code available for payment source

## 2023-10-09 VITALS — BP 114/71 | HR 86 | Temp 98.3°F | Resp 16 | Ht 66.0 in | Wt 228.0 lb

## 2023-10-09 DIAGNOSIS — D500 Iron deficiency anemia secondary to blood loss (chronic): Secondary | ICD-10-CM | POA: Diagnosis not present

## 2023-10-09 DIAGNOSIS — D649 Anemia, unspecified: Secondary | ICD-10-CM

## 2023-10-09 DIAGNOSIS — N920 Excessive and frequent menstruation with regular cycle: Secondary | ICD-10-CM | POA: Diagnosis not present

## 2023-10-09 MED ORDER — ACETAMINOPHEN 325 MG PO TABS
650.0000 mg | ORAL_TABLET | Freq: Once | ORAL | Status: AC
  Administered 2023-10-09: 650 mg via ORAL
  Filled 2023-10-09: qty 2

## 2023-10-09 MED ORDER — IRON SUCROSE 20 MG/ML IV SOLN
200.0000 mg | Freq: Once | INTRAVENOUS | Status: AC
  Administered 2023-10-09: 200 mg via INTRAVENOUS
  Filled 2023-10-09: qty 10

## 2023-10-09 MED ORDER — DIPHENHYDRAMINE HCL 25 MG PO CAPS
25.0000 mg | ORAL_CAPSULE | Freq: Once | ORAL | Status: AC
  Administered 2023-10-09: 25 mg via ORAL
  Filled 2023-10-09: qty 1

## 2023-10-09 NOTE — Progress Notes (Signed)
Diagnosis: Iron Deficiency Anemia  Provider:  Chilton Greathouse MD  Procedure: IV Push  IV Type: Peripheral, IV Location: R Antecubital  Venofer (Iron Sucrose), Dose: 200 mg  Post Infusion IV Care: Observation period completed and Peripheral IV Discontinued  Discharge: Condition: Good, Destination: Home . AVS Provided  Performed by:  Rico Ala, LPN    Pt c/o of itching on her right forearm with some redness. Nurse advised the pt to wait 30 mins so we could monitor her to make sure she was not having an adverse reaction. Pt declined and stated she had to go back to work. Nurse advised the pt if her symptoms get worse to go to the ER and she can take some benadryl at home as well. Pt verbalized understanding and stated she would keep an eye on her symptoms to make sure they do not get any worse.

## 2023-10-15 ENCOUNTER — Ambulatory Visit (INDEPENDENT_AMBULATORY_CARE_PROVIDER_SITE_OTHER): Payer: No Typology Code available for payment source

## 2023-10-15 VITALS — BP 125/76 | HR 85 | Temp 98.2°F | Resp 16 | Ht 66.0 in | Wt 225.8 lb

## 2023-10-15 DIAGNOSIS — N920 Excessive and frequent menstruation with regular cycle: Secondary | ICD-10-CM

## 2023-10-15 DIAGNOSIS — D500 Iron deficiency anemia secondary to blood loss (chronic): Secondary | ICD-10-CM

## 2023-10-15 MED ORDER — IRON SUCROSE 20 MG/ML IV SOLN
200.0000 mg | Freq: Once | INTRAVENOUS | Status: AC
  Administered 2023-10-15: 200 mg via INTRAVENOUS
  Filled 2023-10-15: qty 10

## 2023-10-15 MED ORDER — DIPHENHYDRAMINE HCL 25 MG PO CAPS
25.0000 mg | ORAL_CAPSULE | Freq: Once | ORAL | Status: AC
  Administered 2023-10-15: 25 mg via ORAL
  Filled 2023-10-15: qty 1

## 2023-10-15 MED ORDER — ACETAMINOPHEN 325 MG PO TABS
650.0000 mg | ORAL_TABLET | Freq: Once | ORAL | Status: AC
  Administered 2023-10-15: 650 mg via ORAL
  Filled 2023-10-15: qty 2

## 2023-10-15 NOTE — Progress Notes (Signed)
 Diagnosis: Iron Deficiency Anemia  Provider:  Chilton Greathouse MD  Procedure: IV Push  IV Type: Peripheral, IV Location: R Antecubital  Venofer (Iron Sucrose), Dose: 200 mg  Post Infusion IV Care: Observation period completed and Peripheral IV Discontinued  Discharge: Condition: Good, Destination: Home . AVS Declined  Performed by:  Wyvonne Lenz, RN

## 2023-10-17 DIAGNOSIS — Z419 Encounter for procedure for purposes other than remedying health state, unspecified: Secondary | ICD-10-CM | POA: Diagnosis not present

## 2023-10-21 ENCOUNTER — Ambulatory Visit: Payer: No Typology Code available for payment source

## 2023-10-21 VITALS — BP 117/80 | HR 88 | Temp 98.5°F | Resp 18 | Ht 66.0 in | Wt 228.8 lb

## 2023-10-21 DIAGNOSIS — N920 Excessive and frequent menstruation with regular cycle: Secondary | ICD-10-CM

## 2023-10-21 DIAGNOSIS — D500 Iron deficiency anemia secondary to blood loss (chronic): Secondary | ICD-10-CM

## 2023-10-21 MED ORDER — IRON SUCROSE 20 MG/ML IV SOLN
200.0000 mg | Freq: Once | INTRAVENOUS | Status: AC
  Administered 2023-10-21: 200 mg via INTRAVENOUS
  Filled 2023-10-21: qty 10

## 2023-10-21 MED ORDER — DIPHENHYDRAMINE HCL 25 MG PO CAPS
25.0000 mg | ORAL_CAPSULE | Freq: Once | ORAL | Status: AC
  Administered 2023-10-21: 25 mg via ORAL
  Filled 2023-10-21: qty 1

## 2023-10-21 MED ORDER — SODIUM CHLORIDE 0.9 % IV BOLUS
250.0000 mL | Freq: Once | INTRAVENOUS | Status: AC
  Administered 2023-10-21: 250 mL via INTRAVENOUS
  Filled 2023-10-21: qty 250

## 2023-10-21 MED ORDER — ACETAMINOPHEN 325 MG PO TABS: 650.0000 mg | ORAL_TABLET | Freq: Once | ORAL | Status: AC

## 2023-10-21 NOTE — Progress Notes (Signed)
 Diagnosis: Iron Deficiency Anemia  Provider:  Chilton Greathouse MD  Procedure: IV Push  IV Type: Peripheral, IV Location: R Antecubital  Patient c/o irritation and pressure in right arm near IV site and in upper arm after 8 mL IVP. NS bolus started at 400 mL/hour. IVP completed with NS running for patient comfort.  Venofer (Iron Sucrose), Dose: 200 mg  Post Infusion IV Care: Observation period completed and Peripheral IV Discontinued  Discharge: Condition: Good, Destination: Home . AVS Declined  Performed by:  Rico Ala, LPN

## 2023-10-27 ENCOUNTER — Ambulatory Visit: Payer: No Typology Code available for payment source

## 2023-10-27 MED ORDER — IRON SUCROSE 20 MG/ML IV SOLN: 200.0000 mg | Freq: Once | INTRAVENOUS | Status: DC

## 2023-10-28 ENCOUNTER — Encounter: Payer: Self-pay | Admitting: Nurse Practitioner

## 2023-11-02 ENCOUNTER — Ambulatory Visit (INDEPENDENT_AMBULATORY_CARE_PROVIDER_SITE_OTHER): Payer: No Typology Code available for payment source

## 2023-11-02 VITALS — BP 110/64 | HR 77 | Temp 98.3°F | Resp 16 | Ht 66.0 in | Wt 227.4 lb

## 2023-11-02 DIAGNOSIS — D500 Iron deficiency anemia secondary to blood loss (chronic): Secondary | ICD-10-CM | POA: Diagnosis not present

## 2023-11-02 DIAGNOSIS — N920 Excessive and frequent menstruation with regular cycle: Secondary | ICD-10-CM | POA: Diagnosis not present

## 2023-11-02 MED ORDER — IRON SUCROSE 20 MG/ML IV SOLN
200.0000 mg | Freq: Once | INTRAVENOUS | Status: AC
  Administered 2023-11-02: 200 mg via INTRAVENOUS
  Filled 2023-11-02: qty 10

## 2023-11-02 NOTE — Progress Notes (Signed)
 Diagnosis: Iron Deficiency Anemia  Provider:  Chilton Greathouse MD  Procedure: IV Push  IV Type: Peripheral, IV Location: R Antecubital  Venofer (Iron Sucrose), Dose: 200 mg  Post Infusion IV Care: Observation period completed and Peripheral IV Discontinued  Discharge: Condition: Good, Destination: Home . AVS Declined  Performed by:  Rico Ala, LPN

## 2023-11-11 ENCOUNTER — Other Ambulatory Visit: Payer: Self-pay | Admitting: Medical-Surgical

## 2023-11-13 ENCOUNTER — Encounter: Admitting: Pulmonary Disease

## 2023-11-13 MED ORDER — IRON SUCROSE 20 MG/ML IV SOLN: 200.0000 mg | Freq: Once | INTRAVENOUS | Status: DC

## 2023-11-13 NOTE — Progress Notes (Signed)
 rescheduled

## 2023-11-25 ENCOUNTER — Ambulatory Visit

## 2023-11-26 ENCOUNTER — Telehealth: Payer: Self-pay | Admitting: *Deleted

## 2023-11-26 NOTE — Telephone Encounter (Signed)
 Gave patient updated information about medical records release.

## 2023-11-28 DIAGNOSIS — Z419 Encounter for procedure for purposes other than remedying health state, unspecified: Secondary | ICD-10-CM | POA: Diagnosis not present

## 2023-12-07 ENCOUNTER — Encounter: Payer: Self-pay | Admitting: Nurse Practitioner

## 2023-12-28 DIAGNOSIS — Z419 Encounter for procedure for purposes other than remedying health state, unspecified: Secondary | ICD-10-CM | POA: Diagnosis not present

## 2024-01-07 ENCOUNTER — Ambulatory Visit: Payer: Self-pay

## 2024-01-07 NOTE — Telephone Encounter (Signed)
 FYI

## 2024-01-07 NOTE — Telephone Encounter (Signed)
 Chief Complaint: Back pain  Symptoms: Back pain radiating into left arm, left shoulder Frequency: ongoing for 1.5 months Pertinent Negatives: Patient denies chest pain, chest pressure, difficulty breathing Disposition: [] ED /[x] Urgent Care (no appt availability in office) / [] Appointment(In office/virtual)/ []  [place] [facility]/ [] Home Care/ [] Refused Recommended Disposition /[] [facility]/ []  Follow-up with PCP Additional Notes: Patient initially called in to cancel upcoming appt for 6 month follow up, stating she had to reschedule it for any day for an arrival time of 4 pm or later. E2C2 Specialist cancelled appt, this RN attempted to reschedule but PCP does not have availability. This RN added patient to wait list.   Patient also reporting back pain in the middle of her back ongoing for 1.5 months, stating it has now spread into her left shoulder and left arm. Patient denies chest pain, chest pressure, difficulty breathing, hx of cardiac disease or family history of heart attack, high blood pressure. Patient states she has had fatigue but she does have anemia and has recently finished iron  infusions. Patient educated about s/s of cardiac event and when to call 911. Patient booked with UC appt for tomorrow for evaluation, due to no availability in office.   Reason for Disposition  [1] MODERATE back pain (e.g., interferes with normal activities) AND [2] present > 3 days  Answer Assessment - Initial Assessment Questions 1. ONSET: "When did the pain begin?"      1.5 month ago begun the back pain 2. LOCATION: "Where does it hurt?" (upper, mid or lower back)     Left side middle of back up 3. SEVERITY: "How bad is the pain?"  (e.g., Scale 1-10; mild, moderate, or severe)   - MILD (1-3): Doesn't interfere with normal activities.    - MODERATE (4-7): Interferes with normal activities or awakens from sleep.    - SEVERE (8-10): Excruciating pain, unable to do any normal activities.       Ranges in severity 4. PATTERN: "Is the pain constant?" (e.g., yes, no; constant, intermittent)      At first it was constant 5. RADIATION: "Does the pain shoot into your legs or somewhere else?"     Left shoulder and arm 6. CAUSE:  "What do you think is causing the back pain?"      Unsure 7. BACK OVERUSE:  "Any recent lifting of heavy objects, strenuous work or exercise?"     No 8. MEDICINES: "What have you taken so far for the pain?" (e.g., nothing, acetaminophen , NSAIDS)     Have been taking ibuprofen  800 mg 9. NEUROLOGIC SYMPTOMS: "Do you have any weakness, numbness, or problems with bowel/bladder control?"     No 10. OTHER SYMPTOMS: "Do you have any other symptoms?" (e.g., fever, abdomen pain, burning with urination, blood in urine)       Lethargy  Protocols used: Back Pain-A-AH

## 2024-01-08 ENCOUNTER — Encounter (HOSPITAL_COMMUNITY): Payer: Self-pay

## 2024-01-08 ENCOUNTER — Ambulatory Visit (HOSPITAL_COMMUNITY)
Admission: RE | Admit: 2024-01-08 | Discharge: 2024-01-08 | Disposition: A | Discharge status: Home / Self Care | Source: Ambulatory Visit | Attending: Emergency Medicine | Admitting: Emergency Medicine

## 2024-01-08 ENCOUNTER — Encounter: Payer: Self-pay | Admitting: Nurse Practitioner

## 2024-01-08 VITALS — BP 127/71 | HR 82 | Temp 98.0°F | Resp 18

## 2024-01-08 DIAGNOSIS — B354 Tinea corporis: Secondary | ICD-10-CM | POA: Diagnosis not present

## 2024-01-08 DIAGNOSIS — S46812A Strain of other muscles, fascia and tendons at shoulder and upper arm level, left arm, initial encounter: Secondary | ICD-10-CM

## 2024-01-08 DIAGNOSIS — M62838 Other muscle spasm: Secondary | ICD-10-CM

## 2024-01-08 MED ORDER — DEXAMETHASONE SODIUM PHOSPHATE 10 MG/ML IJ SOLN
10.0000 mg | Freq: Once | INTRAMUSCULAR | Status: AC
  Administered 2024-01-08: 10 mg via INTRAMUSCULAR

## 2024-01-08 MED ORDER — LIDOCAINE 5 % EX PTCH: 1.0000 | MEDICATED_PATCH | CUTANEOUS | 0 refills | Status: DC

## 2024-01-08 MED ORDER — LIDOCAINE 4 % EX PTCH
1.0000 | MEDICATED_PATCH | CUTANEOUS | 0 refills | Status: AC
Start: 2024-01-08 — End: ?

## 2024-01-08 MED ORDER — DEXAMETHASONE SODIUM PHOSPHATE 10 MG/ML IJ SOLN
INTRAMUSCULAR | Status: AC
  Filled 2024-01-08: qty 1

## 2024-01-08 MED ORDER — NYSTATIN 100000 UNIT/GM EX CREA: TOPICAL_CREAM | CUTANEOUS | 0 refills | Status: DC

## 2024-01-08 NOTE — ED Triage Notes (Signed)
 Pt states that she has left upper back pain pain X 2 months that is now radiating to her left arm. She called PCP he advised to come to UC. She takes IBU and heat with temp relief.

## 2024-01-08 NOTE — Discharge Instructions (Addendum)
 You received an injection of Decadron  in clinic today which is a steroid to help with inflammation causing your pain. You can apply a lidocaine  patch once daily for 12 hours for additional pain relief. You can continue to take and milligram ibuprofen  every 8 hours as needed for pain. Alternate this with 650 mg of Tylenol  every 6-8 hours as needed for pain. Alternate between ice and heat as needed for pain. You can follow-up with [facility] if your pain continues. I have prescribed nystatin cream that you can apply twice daily to the area on your breast. Follow-up with primary care provider  or return here as needed.

## 2024-01-08 NOTE — ED Provider Notes (Signed)
 [facility]    CSN: [id number] Arrival date & time: 01/08/24  1742      History   Chief Complaint Chief Complaint  Patient presents with   Back Pain    Back pain radiating into left arm and shoulder - Entered by patient    HPI Bonnie Myers is a 43 y.o. female.   Patient presents with left upper back pain x 2 months.  Patient states that a few weeks ago the pain began to radiate down her left arm.  Patient states that occasionally with certain movements she has some numbness and tingling that shoots down her arm to her fingers.  Patient reports that she has been taking ibuprofen  and applying heat with temporary relief, but then the pain occurs again.  Patient denies any recent falls or injuries.  Patient also reports itchy rash noted to her right breast.  Patient states that initially she had 1 red area that was itchy and then noticed a second area a few days ago.  The history is provided by the patient and medical records.  Back Pain   Past Medical History:  Diagnosis Date   Allergy    Anemia    Anxiety    Asthma    Bronchitis    Depression    Headache    HSV infection    Neuromuscular disorder (HCC)    right - carpal tunnel   Pulmonary embolus (HCC)    [place] - at 23 yrs ago, no problems since   Seasonal allergies    SVD (spontaneous vaginal delivery)    x 2    Patient Active Problem List   Diagnosis Date Noted   Iron  deficiency anemia 09/17/2023   Type 2 diabetes mellitus without complications (HCC) 05/22/2022   Migraine without aura and without status migrainosus, not intractable 01/29/2021   Pulsatile tinnitus of both ears 01/29/2021   Herpes simplex 08/31/2018   Obesity 08/31/2018   Premenstrual tension syndrome 08/31/2018   Menorrhagia with regular cycle 04/01/2018   Bilateral carpal tunnel syndrome 09/15/2017    Past Surgical History:  Procedure Laterality Date   CARPAL TUNNEL RELEASE Left 2019   DILATION AND CURETTAGE OF UTERUS     MAB    DILITATION & CURRETTAGE/HYSTROSCOPY WITH HYDROTHERMAL ABLATION N/A 05/06/2018   Procedure: DILATATION & CURETTAGE WITH MINERVA  ABLATION;  Surgeon: Ana Balling, MD;  Location: [facility];  Service: Gynecology;  Laterality: N/A;   FINGER SURGERY     right hand 4th/ring finger   FRACTURE SURGERY     Right ring finger   LAPAROSCOPIC OVARIAN CYSTECTOMY Right 05/06/2018   Procedure: LAPAROSCOPY DIAGNOSTIC WITH BIOPSY;  Surgeon: Ana Balling, MD;  Location: [facility];  Service: Gynecology;  Laterality: Right;   THERAPEUTIC ABORTION     x 2 recvd anesthesia    TUBAL LIGATION     2006   WISDOM TOOTH EXTRACTION      OB History     Gravida  2   Para  2   Term      Preterm      AB      Living  2      SAB      IAB      Ectopic      Multiple      Live Births  2            Home Medications    Prior to Admission medications   Medication Sig Start Date End  Date Taking? Authorizing Provider  ELDERBERRY PO Take by mouth daily.   Yes [provider]  fexofenadine  (ALLEGRA ) 60 MG tablet Take 1 tablet (60 mg total) by mouth daily. 08/13/23  Yes Cherre Cornish, NP  ibuprofen  (ADVIL ) 800 MG tablet Take 1 tablet (800 mg total) by mouth every 8 (eight) hours as needed. 08/13/23  Yes Cherre Cornish, NP  lidocaine  4 % Place 1 patch onto the skin daily. 01/08/24  Yes Levora Reas A, NP  metFORMIN  (GLUCOPHAGE -XR) 500 MG 24 hr tablet Take 1 tablet (500 mg total) by mouth 2 (two) times daily with a meal. 08/13/23  Yes Jessup, Joy, NP  montelukast  (SINGULAIR ) 10 MG tablet TAKE 2 TABLETS BY MOUTH EVERY MORNING. 11/11/23  Yes Cherre Cornish, NP  nystatin cream (MYCOSTATIN) Apply to affected area 2 times daily 01/08/24  Yes Levora Reas A, NP  Probiotic Product (PROBIOTIC PO) Take by mouth daily.   Yes [provider]  Accu-Chek Softclix Lancets lancets Use as instructed 08/13/23   Cherre Cornish, NP  albuterol  (VENTOLIN  HFA) 108 (90 Base) MCG/ACT inhaler Inhale 1-2 puffs into the  lungs every 6 (six) hours as needed for wheezing or shortness of breath. 08/03/22   Ward, Char Common, PA-C  aspirin 81 MG EC tablet Take by mouth.    [provider]  glucose blood (ACCU-CHEK GUIDE TEST) test strip Use as instructed 08/13/23   Cherre Cornish, NP  promethazine -dextromethorphan (PROMETHAZINE -DM) 6.25-15 MG/5ML syrup Take 5 mLs by mouth 4 (four) times daily as needed for cough. 08/03/22   Ward, Char Common, PA-C    Family History Family History  Problem Relation Age of Onset   Colon cancer Paternal Grandmother    Diabetes Paternal Grandmother    Hypertension Father    Diabetes Father    Stroke Father    Endometrial cancer Mother    Diabetes Mother    Hypertension Mother    Cancer Mother    Diabetes Maternal Grandmother    Diabetes Paternal Grandfather    Vision loss Paternal Grandfather    Asthma Sister    Diabetes Sister    Miscarriages / Stillbirths Sister    Diabetes Maternal Uncle    Diabetes Maternal Uncle    Stroke Maternal Uncle     Social History Social History   Tobacco Use   Smoking status: Never   Smokeless tobacco: Never  Vaping Use   Vaping status: Never Used  Substance Use Topics   Alcohol use: Yes    Alcohol/week: 3.0 standard drinks of alcohol    Types: 3 Standard drinks or equivalent per week    Comment: wine/liquor    Drug use: Not Currently    Types: Marijuana    Comment: Hs marijuana - last use in early 20s     Allergies   Penicillins, Azithromycin, Bee venom, Clindamycin/lincomycin, Coumadin [warfarin], Diflucan [fluconazole], and Naproxen   Review of Systems Review of Systems  Musculoskeletal:  Positive for back pain.   Per HPI  Physical Exam Triage Vital Signs ED Triage Vitals  Encounter Vitals Group     BP 01/08/24 1808 127/71     Systolic BP Percentile --      Diastolic BP Percentile --      Pulse Rate 01/08/24 1808 82     Resp 01/08/24 1808 18     Temp 01/08/24 1808 98 F (36.7 C)     Temp Source 01/08/24  1808 Oral     SpO2 01/08/24 1808 98 %  Weight --      Height --      Head Circumference --      Peak Flow --      Pain Score 01/08/24 1805 7     Pain Loc --      Pain Education --      Exclude from Growth Chart --    No data found.  Updated Vital Signs BP 127/71 (BP Location: Right Arm)   Pulse 82   Temp 98 F (36.7 C) (Oral)   Resp 18   LMP 12/21/2023 (Approximate)   SpO2 98%   Visual Acuity Right Eye Distance:   Left Eye Distance:   Bilateral Distance:    Right Eye Near:   Left Eye Near:    Bilateral Near:     Physical Exam Vitals and nursing note reviewed.  Constitutional:      General: She is awake. She is not in acute distress.    Appearance: Normal appearance. She is well-developed and well-groomed. She is not ill-appearing.  Musculoskeletal:     Cervical back: Normal.     Thoracic back: Tenderness present. No swelling, deformity, signs of trauma or bony tenderness. Normal range of motion.     Lumbar back: Normal.     Comments: Tenderness upon palpation to left trapezius muscle.  Patient endorses numbness and tingling upon abduction of the left shoulder.  Skin:    General: Skin is warm and dry.     Findings: Rash present.     Comments: 2 erythematous areas noted under right breast in skin fold.  Neurological:     Mental Status: She is alert.  Psychiatric:        Behavior: Behavior is cooperative.      UC Treatments / Results  Labs (all labs ordered are listed, but only abnormal results are displayed) Labs Reviewed - No data to display  EKG   Radiology No results found.  Procedures Procedures (including critical care time)  Medications Ordered in UC Medications  dexamethasone  (DECADRON ) injection 10 mg (has no administration in time range)    Initial Impression / Assessment and Plan / UC Course  I have reviewed the triage vital signs and the nursing notes.  Pertinent labs & imaging results that were available during my care of the  patient were reviewed by me and considered in my medical decision making (see chart for details).     Patient is well-appearing.  Vitals are stable.  Upon assessment tenderness upon palpation to left trapezius muscle.  Patient endorses numbness and tingling upon abduction of the left shoulder.  Given IM Decadron  in clinic.  Prescribed lidocaine  patches for additional pain relief.  Recommended continuing ibuprofen  and alternating this with Tylenol  as needed.  Given orthopedic follow-up.  Also upon assessment there are 2 erythematous areas noted under right breast in skin fold.  Prescribed nystatin cream for today for tinea corporis coverage.  Discussed follow-up and return precautions. Final Clinical Impressions(s) / UC Diagnoses   Final diagnoses:  Tinea corporis  Trapezius muscle strain, left, initial encounter  Trapezius muscle spasm     Discharge Instructions      You received an injection of Decadron  in clinic today which is a steroid to help with inflammation causing your pain. You can apply a lidocaine  patch once daily for 12 hours for additional pain relief. You can continue to take and milligram ibuprofen  every 8 hours as needed for pain. Alternate this with 650 mg of Tylenol  every 6-8 hours  as needed for pain. Alternate between ice and heat as needed for pain. You can follow-up with [facility] if your pain continues. I have prescribed nystatin cream that you can apply twice daily to the area on your breast. Follow-up with primary care provider  or return here as needed.  ED Prescriptions     Medication Sig Dispense Auth. Provider   lidocaine  (LIDODERM ) 5 %  (Status: Discontinued) Place 1 patch onto the skin daily. Remove & Discard patch within 12 hours or as directed by MD 30 patch Levora Reas A, NP   nystatin cream (MYCOSTATIN) Apply to affected area 2 times daily 30 g Levora Reas A, NP   lidocaine  4 % Place 1 patch onto the skin daily. 20 patch Levora Reas  A, NP      PDMP not reviewed this encounter.   Levora Reas A, NP 01/08/24 [phone]

## 2024-01-28 DIAGNOSIS — Z419 Encounter for procedure for purposes other than remedying health state, unspecified: Secondary | ICD-10-CM | POA: Diagnosis not present

## 2024-02-11 ENCOUNTER — Ambulatory Visit: Payer: No Typology Code available for payment source | Admitting: Medical-Surgical

## 2024-02-13 LAB — HM DIABETES EYE EXAM

## 2024-02-15 ENCOUNTER — Encounter: Payer: Self-pay | Admitting: Nurse Practitioner

## 2024-02-19 ENCOUNTER — Other Ambulatory Visit: Payer: Self-pay | Admitting: Medical-Surgical

## 2024-02-27 DIAGNOSIS — Z419 Encounter for procedure for purposes other than remedying health state, unspecified: Secondary | ICD-10-CM | POA: Diagnosis not present

## 2024-03-23 ENCOUNTER — Other Ambulatory Visit: Payer: Self-pay | Admitting: Medical-Surgical

## 2024-03-24 ENCOUNTER — Encounter: Payer: Self-pay | Admitting: Medical-Surgical

## 2024-03-24 ENCOUNTER — Ambulatory Visit (INDEPENDENT_AMBULATORY_CARE_PROVIDER_SITE_OTHER): Admitting: Medical-Surgical

## 2024-03-24 VITALS — BP 111/71 | HR 83 | Resp 20 | Ht 66.0 in | Wt 231.0 lb

## 2024-03-24 DIAGNOSIS — E119 Type 2 diabetes mellitus without complications: Secondary | ICD-10-CM | POA: Diagnosis not present

## 2024-03-24 DIAGNOSIS — Z7984 Long term (current) use of oral hypoglycemic drugs: Secondary | ICD-10-CM

## 2024-03-24 DIAGNOSIS — Z0000 Encounter for general adult medical examination without abnormal findings: Secondary | ICD-10-CM

## 2024-03-24 DIAGNOSIS — J4520 Mild intermittent asthma, uncomplicated: Secondary | ICD-10-CM | POA: Insufficient documentation

## 2024-03-24 DIAGNOSIS — J45909 Unspecified asthma, uncomplicated: Secondary | ICD-10-CM | POA: Insufficient documentation

## 2024-03-24 DIAGNOSIS — D500 Iron deficiency anemia secondary to blood loss (chronic): Secondary | ICD-10-CM | POA: Diagnosis not present

## 2024-03-24 DIAGNOSIS — F489 Nonpsychotic mental disorder, unspecified: Secondary | ICD-10-CM | POA: Insufficient documentation

## 2024-03-24 LAB — POCT GLYCOSYLATED HEMOGLOBIN (HGB A1C)
HbA1c, POC (controlled diabetic range): 6.8 % (ref 0.0–7.0)
Hemoglobin A1C: 6.8 % — AB (ref 4.0–5.6)

## 2024-03-24 LAB — POCT UA - MICROALBUMIN
Albumin/Creatinine Ratio, Urine, POC: <30
Creatinine, POC: 100 mg/dL
Microalbumin Ur, POC: 10 mg/L

## 2024-03-24 MED ORDER — METFORMIN HCL ER 500 MG PO TB24
500.0000 mg | ORAL_TABLET | Freq: Every day | ORAL | 3 refills | Status: AC
Start: 2024-03-24 — End: ?

## 2024-03-24 MED ORDER — MONTELUKAST SODIUM 10 MG PO TABS: 20.0000 mg | ORAL_TABLET | Freq: Every morning | ORAL | 3 refills | Status: AC

## 2024-03-24 MED ORDER — ALBUTEROL SULFATE HFA 108 (90 BASE) MCG/ACT IN AERS: 1.0000 | INHALATION_SPRAY | Freq: Four times a day (QID) | RESPIRATORY_TRACT | 0 refills | Status: AC | PRN

## 2024-03-24 NOTE — Progress Notes (Signed)
 Complete physical exam  Patient: Bonnie Myers   DOB: 1981-01-14   43 y.o. Female  MRN: [id number]  Subjective:    Chief Complaint  Patient presents with   Annual Exam    Cia Garretson is a 43 y.o. female who presents today for a complete physical exam. She reports consuming a general diet. Some exercise but not as much as she should. She generally feels fairly well. She reports sleeping poorly. She does not have additional problems to discuss today.    Most recent fall risk assessment:    10/15/2023    1:03 PM  Fall Risk   Falls in the past year? 1  Number falls in past yr: 0  Injury with Fall? 0  Risk for fall due to : History of fall(s)  Follow up Falls evaluation completed     Most recent depression screenings:    10/15/2023    3:47 PM 08/13/2023    3:03 PM  PHQ 2/9 Scores  PHQ - 2 Score 0 2    Vision:Within last year, Dental: No current dental problems and Receives regular dental care, and STD: The patient denies history of sexually transmitted disease.    Patient Care Team: Willo Mini, NP as PCP - General (Nurse Practitioner)   Outpatient Medications Prior to Visit  Medication Sig   Accu-Chek Softclix Lancets lancets Use as instructed   aspirin 81 MG EC tablet Take by mouth.   ELDERBERRY PO Take by mouth daily.   fexofenadine  (ALLEGRA ) 60 MG tablet Take 1 tablet (60 mg total) by mouth daily.   glucose blood (ACCU-CHEK GUIDE TEST) test strip Use as instructed   ibuprofen  (ADVIL ) 800 MG tablet Take 1 tablet (800 mg total) by mouth every 8 (eight) hours as needed.   lidocaine  4 % Place 1 patch onto the skin daily.   Probiotic Product (PROBIOTIC PO) Take by mouth daily.   [DISCONTINUED] albuterol  (VENTOLIN  HFA) 108 (90 Base) MCG/ACT inhaler Inhale 1-2 puffs into the lungs every 6 (six) hours as needed for wheezing or shortness of breath.   [DISCONTINUED] montelukast  (SINGULAIR ) 10 MG tablet TAKE 2 TABLETS BY MOUTH EVERY MORNING.   [DISCONTINUED] metFORMIN   (GLUCOPHAGE -XR) 500 MG 24 hr tablet TAKE 1 TABLET BY MOUTH 2 TIMES DAILY WITH A MEAL.   [DISCONTINUED] nystatin  cream (MYCOSTATIN ) Apply to affected area 2 times daily   [DISCONTINUED] promethazine -dextromethorphan (PROMETHAZINE -DM) 6.25-15 MG/5ML syrup Take 5 mLs by mouth 4 (four) times daily as needed for cough.   Facility-Administered Medications Prior to Visit  Medication Dose Route Frequency Provider   acetaminophen  (TYLENOL ) tablet 650 mg  650 mg Oral Once Boscia, Heather E, NP    Review of Systems  Respiratory: Negative.    Cardiovascular: Negative.   Gastrointestinal:  Positive for constipation.  Genitourinary: Negative.   Musculoskeletal:  Positive for back pain and neck pain.  Neurological: Negative.   Psychiatric/Behavioral:  Positive for depression. Negative for suicidal ideas. The patient is nervous/anxious and has insomnia.      Objective:    BP 111/71 (BP Location: Right Arm, Cuff Size: Normal)   Pulse 83   Resp 20   Ht 5' 6 (1.676 m)   Wt 231 lb (104.8 kg)   SpO2 96%   BMI 37.28 kg/m    Physical Exam Vitals reviewed.  Constitutional:      General: She is not in acute distress.    Appearance: Normal appearance. She is obese. She is not ill-appearing.  HENT:     Head:  Normocephalic and atraumatic.     Right Ear: Tympanic membrane, ear canal and external ear normal. There is no impacted cerumen.     Left Ear: Tympanic membrane, ear canal and external ear normal. There is no impacted cerumen.     Nose: Nose normal. No congestion or rhinorrhea.     Mouth/Throat:     Mouth: Mucous membranes are moist.     Pharynx: No oropharyngeal exudate or posterior oropharyngeal erythema.  Eyes:     General: No scleral icterus.       Right eye: No discharge.        Left eye: No discharge.     Extraocular Movements: Extraocular movements intact.     Conjunctiva/sclera: Conjunctivae normal.     Pupils: Pupils are equal, round, and reactive to light.  Neck:     Thyroid:  No thyromegaly.     Vascular: No carotid bruit or JVD.     Trachea: Trachea normal.  Cardiovascular:     Rate and Rhythm: Normal rate and regular rhythm.     Pulses: Normal pulses.     Heart sounds: Normal heart sounds. No murmur heard.    No friction rub. No gallop.  Pulmonary:     Effort: Pulmonary effort is normal. No respiratory distress.     Breath sounds: Normal breath sounds. No wheezing.  Abdominal:     General: Bowel sounds are normal. There is no distension.     Palpations: Abdomen is soft.     Tenderness: There is no abdominal tenderness. There is no guarding.  Musculoskeletal:        General: Normal range of motion.     Cervical back: Normal range of motion and neck supple.  Lymphadenopathy:     Cervical: No cervical adenopathy.  Skin:    General: Skin is warm and dry.  Neurological:     Mental Status: She is alert and oriented to person, place, and time.     Cranial Nerves: No cranial nerve deficit.  Psychiatric:        Mood and Affect: Mood normal.        Behavior: Behavior normal.        Thought Content: Thought content normal.        Judgment: Judgment normal.      Results for orders placed or performed in visit on 03/24/24  POCT HgB A1C  Result Value Ref Range   Hemoglobin A1C 6.8 (A) 4.0 - 5.6 %   HbA1c POC (<> result, manual entry)     HbA1c, POC (prediabetic range)     HbA1c, POC (controlled diabetic range) 6.8 0.0 - 7.0 %  POCT UA - Microalbumin  Result Value Ref Range   Microalbumin Ur, POC 10 mg/L   Creatinine, POC 100 mg/dL   Albumin/Creatinine Ratio, Urine, POC <30        Assessment & Plan:    Routine Health Maintenance and Physical Exam  Immunization History  Administered Date(s) Administered   PFIZER Comirnaty(Gray Top)Covid-19 Tri-Sucrose Vaccine 10/15/2020, 11/07/2020   Tdap 09/25/2014    Health Maintenance  Topic Date Due   Pneumococcal Vaccine: 19-49 Years (1 of 2 - PCV) Never done   Hepatitis B Vaccines (1 of 3 - 19+ 3-dose  series) Never done   HPV VACCINES (1 - 3-dose SCDM series) Never done   COVID-19 Vaccine (3 - 2024-25 season) 04/09/2024 (Originally 04/19/2023)   INFLUENZA VACCINE  11/15/2024 (Originally 03/18/2024)   Hepatitis C Screening  03/24/2025 (Originally 10/17/1997)  HIV Screening  03/24/2025 (Originally 10/18/1994)   Diabetic kidney evaluation - eGFR measurement  08/12/2024   FOOT EXAM  08/12/2024   HEMOGLOBIN A1C  09/24/2024   DTaP/Tdap/Td (2 - Td or Tdap) 09/25/2024   OPHTHALMOLOGY EXAM  02/12/2025   Diabetic kidney evaluation - Urine ACR  03/24/2025   Cervical Cancer Screening (HPV/Pap Cotest)  04/25/2026   Meningococcal B Vaccine  Aged Out    Discussed health benefits of physical activity, and encouraged her to engage in regular exercise appropriate for her age and condition.  1. Annual physical exam (Primary) Checking labs as below. UTD on preventative care. Wellness information provided with AVS.  - CBC - CMP14+EGFR - Lipid panel  2. Type 2 diabetes mellitus without complication, without long-term current use of insulin (HCC) HgbA1c at 6.8% today, well controlled. Continue Metformin  500mg  once daily with breakfast. Microalbumin normal. Checking lipids. Ok to add Berberine in the evening if desired. Monitor glucose regularly. Continue working on diabetic diet modifications and regular intentional exercise with a goal for weight loss to a healthy weight.  - POCT HgB A1C - metFORMIN  (GLUCOPHAGE -XR) 500 MG 24 hr tablet; Take 1 tablet (500 mg total) by mouth daily with breakfast.  Dispense: 90 tablet; Refill: 3 - Lipid panel - POCT UA - Microalbumin  3. Iron  deficiency anemia due to chronic blood loss Checking CBC.   4. Mild intermittent asthma without complication Stable on Singulair  with prn albuterol . Refilling Albuterol  inhaler.  - albuterol  (VENTOLIN  HFA) 108 (90 Base) MCG/ACT inhaler; Inhale 1-2 puffs into the lungs every 6 (six) hours as needed for wheezing or shortness of breath.   Dispense: 1 each; Refill: 0  5. Mental health problem Struggling with profound depression and anxiety. Previously did counseling through the [facility] app but this got too costly and she stopped. Not currently interested in starting medications at this time but is looking to get FMLA for her mental health concerns.   Return in about 6 months (around 09/24/2024) for DM/HTN/HLD follow up.     Semaja Lymon, NP

## 2024-03-24 NOTE — Patient Instructions (Signed)
 Preventive Care 16-44 Years Old, Female  Preventive care refers to lifestyle choices and visits with your health care provider that can promote health and wellness. Preventive care visits are also called wellness exams.  What can I expect for my preventive care visit?  Counseling  Your health care provider may ask you questions about your:  Medical history, including:  Past medical problems.  Family medical history.  Pregnancy history.  Current health, including:  Menstrual cycle.  Method of birth control.  Emotional well-being.  Home life and relationship well-being.  Sexual activity and sexual health.  Lifestyle, including:  Alcohol, nicotine or tobacco, and drug use.  Access to firearms.  Diet, exercise, and sleep habits.  Work and work Astronomer.  Sunscreen use.  Safety issues such as seatbelt and bike helmet use.  Physical exam  Your health care provider will check your:  Height and weight. These may be used to calculate your BMI (body mass index). BMI is a measurement that tells if you are at a healthy weight.  Waist circumference. This measures the distance around your waistline. This measurement also tells if you are at a healthy weight and may help predict your risk of certain diseases, such as type 2 diabetes and high blood pressure.  Heart rate and blood pressure.  Body temperature.  Skin for abnormal spots.  What immunizations do I need?    Vaccines are usually given at various ages, according to a schedule. Your health care provider will recommend vaccines for you based on your age, medical history, and lifestyle or other factors, such as travel or where you work.  What tests do I need?  Screening  Your health care provider may recommend screening tests for certain conditions. This may include:  Lipid and cholesterol levels.  Diabetes screening. This is done by checking your blood sugar (glucose) after you have not eaten for a while (fasting).  Pelvic exam and Pap test.  Hepatitis B test.  Hepatitis C  test.  HIV (human immunodeficiency virus) test.  STI (sexually transmitted infection) testing, if you are at risk.  Lung cancer screening.  Colorectal cancer screening.  Mammogram. Talk with your health care provider about when you should start having regular mammograms. This may depend on whether you have a family history of breast cancer.  BRCA-related cancer screening. This may be done if you have a family history of breast, ovarian, tubal, or peritoneal cancers.  Bone density scan. This is done to screen for osteoporosis.  Talk with your health care provider about your test results, treatment options, and if necessary, the need for more tests.  Follow these instructions at home:  Eating and drinking    Eat a diet that includes fresh fruits and vegetables, whole grains, lean protein, and low-fat dairy products.  Take vitamin and mineral supplements as recommended by your health care provider.  Do not drink alcohol if:  Your health care provider tells you not to drink.  You are pregnant, may be pregnant, or are planning to become pregnant.  If you drink alcohol:  Limit how much you have to 0-1 drink a day.  Know how much alcohol is in your drink. In the [place] one drink equals one 12 oz bottle of beer (355 mL), one 5 oz glass of wine (148 mL), or one 1 oz glass of hard liquor (44 mL).  Lifestyle  Brush your teeth every morning and night with fluoride toothpaste. Floss one time each day.  Exercise for at least  30 minutes 5 or more days each week.  Do not use any products that contain nicotine or tobacco. These products include cigarettes, chewing tobacco, and vaping devices, such as e-cigarettes. If you need help quitting, ask your health care provider.  Do not use drugs.  If you are sexually active, practice safe sex. Use a condom or other form of protection to prevent STIs.  If you do not wish to become pregnant, use a form of birth control. If you plan to become pregnant, see your health care provider for a  prepregnancy visit.  Take aspirin only as told by your health care provider. Make sure that you understand how much to take and what form to take. Work with your health care provider to find out whether it is safe and beneficial for you to take aspirin daily.  Find healthy ways to manage stress, such as:  Meditation, yoga, or listening to music.  Journaling.  Talking to a trusted person.  Spending time with friends and family.  Minimize exposure to UV radiation to reduce your risk of skin cancer.  Safety  Always wear your seat belt while driving or riding in a vehicle.  Do not drive:  If you have been drinking alcohol. Do not ride with someone who has been drinking.  When you are tired or distracted.  While texting.  If you have been using any mind-altering substances or drugs.  Wear a helmet and other protective equipment during sports activities.  If you have firearms in your house, make sure you follow all gun safety procedures.  Seek help if you have been physically or sexually abused.  What's next?  Visit your health care provider once a year for an annual wellness visit.  Ask your health care provider how often you should have your eyes and teeth checked.  Stay up to date on all vaccines.  This information is not intended to replace advice given to you by your health care provider. Make sure you discuss any questions you have with your health care provider.  Document Revised: 01/30/2021 Document Reviewed: 01/30/2021  Elsevier Patient Education  2024 [employer].

## 2024-03-25 ENCOUNTER — Ambulatory Visit: Payer: Self-pay | Admitting: Medical-Surgical

## 2024-03-25 DIAGNOSIS — E1169 Type 2 diabetes mellitus with other specified complication: Secondary | ICD-10-CM

## 2024-03-25 LAB — CMP14+EGFR
ALT: 12 IU/L (ref 0–32)
AST: 10 IU/L (ref 0–40)
Albumin: 4.1 g/dL (ref 3.9–4.9)
Alkaline Phosphatase: 155 IU/L — ABNORMAL HIGH (ref 44–121)
BUN/Creatinine Ratio: 12 (ref 9–23)
BUN: 10 mg/dL (ref 6–24)
Bilirubin Total: <0.2 mg/dL (ref 0.0–1.2)
CO2: 23 mmol/L (ref 20–29)
Calcium: 9.4 mg/dL (ref 8.7–10.2)
Chloride: 101 mmol/L (ref 96–106)
Creatinine, Ser: 0.86 mg/dL (ref 0.57–1.00)
Globulin, Total: 3 g/dL (ref 1.5–4.5)
Glucose: 88 mg/dL (ref 70–99)
Potassium: 4.5 mmol/L (ref 3.5–5.2)
Sodium: 139 mmol/L (ref 134–144)
Total Protein: 7.1 g/dL (ref 6.0–8.5)
eGFR: 85 mL/min/1.73 (ref >59)

## 2024-03-25 LAB — LIPID PANEL
Chol/HDL Ratio: 4.3 ratio (ref 0.0–4.4)
Cholesterol, Total: 182 mg/dL (ref 100–199)
HDL: 42 mg/dL (ref >39)
LDL Chol Calc (NIH): 101 mg/dL — ABNORMAL HIGH (ref 0–99)
Triglycerides: 228 mg/dL — ABNORMAL HIGH (ref 0–149)
VLDL Cholesterol Cal: 39 mg/dL (ref 5–40)

## 2024-03-25 LAB — CBC
Hematocrit: 39.3 % (ref 34.0–46.6)
Hemoglobin: 12 g/dL (ref 11.1–15.9)
MCH: 24.5 pg — ABNORMAL LOW (ref 26.6–33.0)
MCHC: 30.5 g/dL — ABNORMAL LOW (ref 31.5–35.7)
MCV: 80 fL (ref 79–97)
Platelets: 347 x10E3/uL (ref 150–450)
RBC: 4.89 x10E6/uL (ref 3.77–5.28)
RDW: 16.3 % — ABNORMAL HIGH (ref 11.7–15.4)
WBC: 12.2 x10E3/uL — ABNORMAL HIGH (ref 3.4–10.8)

## 2024-03-25 MED ORDER — ATORVASTATIN CALCIUM 10 MG PO TABS: 10.0000 mg | ORAL_TABLET | Freq: Every day | ORAL | 3 refills | Status: AC

## 2024-03-26 LAB — SPECIMEN STATUS REPORT

## 2024-03-26 LAB — IRON AND TIBC
Iron Saturation: 10 % — ABNORMAL LOW (ref 15–55)
Iron: 37 ug/dL (ref 27–159)
Total Iron Binding Capacity: 365 ug/dL (ref 250–450)
UIBC: 328 ug/dL (ref 131–425)

## 2024-03-26 LAB — FERRITIN: Ferritin: 46 ng/mL (ref 15–150)

## 2024-03-29 DIAGNOSIS — Z419 Encounter for procedure for purposes other than remedying health state, unspecified: Secondary | ICD-10-CM | POA: Diagnosis not present

## 2024-04-29 DIAGNOSIS — Z419 Encounter for procedure for purposes other than remedying health state, unspecified: Secondary | ICD-10-CM | POA: Diagnosis not present

## 2024-05-29 DIAGNOSIS — Z419 Encounter for procedure for purposes other than remedying health state, unspecified: Secondary | ICD-10-CM | POA: Diagnosis not present

## 2024-07-01 ENCOUNTER — Ambulatory Visit: Admitting: Medical-Surgical

## 2024-07-04 ENCOUNTER — Telehealth (INDEPENDENT_AMBULATORY_CARE_PROVIDER_SITE_OTHER): Admitting: Medical-Surgical

## 2024-07-04 DIAGNOSIS — E119 Type 2 diabetes mellitus without complications: Secondary | ICD-10-CM | POA: Diagnosis not present

## 2024-07-04 DIAGNOSIS — R202 Paresthesia of skin: Secondary | ICD-10-CM | POA: Diagnosis not present

## 2024-07-04 DIAGNOSIS — Z7984 Long term (current) use of oral hypoglycemic drugs: Secondary | ICD-10-CM

## 2024-07-04 DIAGNOSIS — F489 Nonpsychotic mental disorder, unspecified: Secondary | ICD-10-CM

## 2024-07-04 DIAGNOSIS — D500 Iron deficiency anemia secondary to blood loss (chronic): Secondary | ICD-10-CM

## 2024-07-04 DIAGNOSIS — Z0289 Encounter for other administrative examinations: Secondary | ICD-10-CM

## 2024-07-04 DIAGNOSIS — N926 Irregular menstruation, unspecified: Secondary | ICD-10-CM

## 2024-07-04 NOTE — Progress Notes (Unsigned)
 Virtual Visit via Video Note  I connected with Bonnie Myers on 07/06/24 at  2:00 PM EST by a video enabled telemedicine application and verified that I am speaking with the correct person using two identifiers.   I discussed the limitations of evaluation and management by telemedicine and the availability of in person appointments. The patient expressed understanding and agreed to proceed.  Patient location: home Provider locations: office  Subjective:    Discussed the use of AI scribe software for clinical note transcription with the patient, who gave verbal consent to proceed.  History of Present Illness   Bonnie Myers is a 43 year old female who presents for follow-up regarding intermittent FMLA paperwork and management of her cholesterol and diabetes.  Psychological stress and mood disorders - Requires intermittent FMLA paperwork to accommodate medical appointments for management of chronic diseases, anxiety, and depression - Estimates needing three to four days per week, with each session lasting approximately four hours  Hyperlipidemia and diabetes management - Not taking atorvastatin  for cholesterol management - Utilizing natural methods including dietary changes and berberine - Continues metformin  therapy at night - Interested in rechecking blood work to assess impact of current regimen  Paresthesia of right foot - New onset tingly sensation between the fourth and fifth toes of the right foot - Described as a 'weird feeling' similar to an impending muscle spasm - Occurs consistently with initiation of walking - No numbness or loss of sensation  Menstrual irregularity and associated symptoms - Irregular menstrual cycles with last cycle prolonged - Prolonged cycle resulted in lightheadedness and fatigue - Menstrual flow has settled, but persistent weakness and fatigue remain - Initiated iron  supplement containing vitamin C, folate, vitamin B12, and iron  - Some  improvement in energy levels since starting supplementation     Past medical history, Surgical history, Family history not pertinant except as noted below, Social history, Allergies, and medications have been entered into the medical record, reviewed, and corrections made.   Review of Systems: See HPI for pertinent positives and negatives.   Objective:    General: Speaking clearly in complete sentences without any shortness of breath.  Alert and oriented x3.  Normal judgment. No apparent acute distress.  Impression and Recommendations:    Encounter for form completion Experiencing anxiety, depression related to life circumstances and chronic diseases that require frequent follow-ups. Seeking intermittent FMLA for appointments treatments, and recovery. - Completed FMLA paperwork for intermittent leave, 3-4 days per week, 4-8 hours per day.  Type 2 diabetes mellitus without complication, without long-term current use of insulin (HCC)  Opted not to take atorvastatin . Monitoring cholesterol levels and dietary changes with berberine. - Ordered blood work to assess cholesterol levels and effectiveness of berberine.  Right foot paresthesia likely due to lumbar radiculopathy Tingling between fourth and fifth toes of right foot likely due to lumbar radiculopathy from early lumbar disc herniation. - Continue chiropractic treatment for lumbar disc herniation.  Iron  deficiency anemia due to chronic blood loss Fatigue and lightheadedness likely due to iron  deficiency anemia from chronic blood loss. Started iron  supplementation with some improvement. - Ordered blood work to recheck iron  levels and blood counts.  Irregular menstruation Irregular menstruation with recent heavy flow. Undergoing evaluation with plans for biopsy. - Schedule and complete biopsy as recommended by gynecologist.     I discussed the assessment and treatment plan with the patient. The patient was provided an opportunity to  ask questions and all were answered. The patient agreed with the plan  and demonstrated an understanding of the instructions.   The patient was advised to call back or seek an in-person evaluation if the symptoms worsen or if the condition fails to improve as anticipated.  Return in about 3 months (around 10/04/2024) for DM follow up.  Zada FREDRIK Palin, DNP, APRN, FNP-BC [facility]

## 2024-07-05 ENCOUNTER — Encounter: Payer: Self-pay | Admitting: Medical-Surgical

## 2024-07-06 ENCOUNTER — Encounter: Payer: Self-pay | Admitting: Medical-Surgical

## 2024-07-28 ENCOUNTER — Encounter: Payer: Self-pay | Admitting: Obstetrics and Gynecology

## 2024-07-28 DIAGNOSIS — Z1231 Encounter for screening mammogram for malignant neoplasm of breast: Secondary | ICD-10-CM

## 2024-07-29 ENCOUNTER — Other Ambulatory Visit: Payer: Self-pay | Admitting: Medical-Surgical

## 2024-07-29 DIAGNOSIS — Z1231 Encounter for screening mammogram for malignant neoplasm of breast: Secondary | ICD-10-CM

## 2024-07-29 DIAGNOSIS — Z419 Encounter for procedure for purposes other than remedying health state, unspecified: Secondary | ICD-10-CM | POA: Diagnosis not present

## 2024-08-30 ENCOUNTER — Ambulatory Visit

## 2024-09-22 ENCOUNTER — Ambulatory Visit: Admitting: Medical-Surgical

## 2024-10-13 ENCOUNTER — Ambulatory Visit: Admitting: Medical-Surgical

## 2024-10-19 ENCOUNTER — Ambulatory Visit
